# Patient Record
Sex: Male | Born: 1975 | Race: White | Hispanic: No | Marital: Married | Smoking: Former smoker
Health system: Southern US, Community
[De-identification: ages and names within clinical notes are randomized; demographics above are authoritative.]

## PROBLEM LIST (undated history)

## (undated) DIAGNOSIS — G43909 Migraine, unspecified, not intractable, without status migrainosus: Secondary | ICD-10-CM

## (undated) HISTORY — DX: Migraine, unspecified, not intractable, without status migrainosus: G43.909

---

## 2013-03-03 LAB — CBC AND DIFFERENTIAL
Neutrophils Absolute: 5
WBC: 6.9

## 2013-03-03 LAB — BASIC METABOLIC PANEL
BUN: 11 (ref 4–21)
Creatinine: 0.9 (ref 0.6–1.3)
Glucose: 94

## 2018-09-18 ENCOUNTER — Ambulatory Visit: Payer: Self-pay | Admitting: Family Medicine

## 2018-09-26 ENCOUNTER — Other Ambulatory Visit: Payer: Self-pay

## 2018-09-26 ENCOUNTER — Ambulatory Visit (INDEPENDENT_AMBULATORY_CARE_PROVIDER_SITE_OTHER): Payer: PRIVATE HEALTH INSURANCE | Admitting: Family Medicine

## 2018-09-26 ENCOUNTER — Encounter: Payer: Self-pay | Admitting: Family Medicine

## 2018-09-26 VITALS — BP 110/64 | HR 66 | Temp 98.4°F | Ht 66.0 in | Wt 157.2 lb

## 2018-09-26 DIAGNOSIS — G43809 Other migraine, not intractable, without status migrainosus: Secondary | ICD-10-CM | POA: Diagnosis not present

## 2018-09-26 DIAGNOSIS — Z0001 Encounter for general adult medical examination with abnormal findings: Secondary | ICD-10-CM | POA: Diagnosis not present

## 2018-09-26 DIAGNOSIS — Z13220 Encounter for screening for lipoid disorders: Secondary | ICD-10-CM | POA: Diagnosis not present

## 2018-09-26 DIAGNOSIS — Z6825 Body mass index (BMI) 25.0-25.9, adult: Secondary | ICD-10-CM | POA: Diagnosis not present

## 2018-09-26 DIAGNOSIS — F17200 Nicotine dependence, unspecified, uncomplicated: Secondary | ICD-10-CM

## 2018-09-26 DIAGNOSIS — R29898 Other symptoms and signs involving the musculoskeletal system: Secondary | ICD-10-CM

## 2018-09-26 DIAGNOSIS — G43909 Migraine, unspecified, not intractable, without status migrainosus: Secondary | ICD-10-CM | POA: Insufficient documentation

## 2018-09-26 DIAGNOSIS — Z87891 Personal history of nicotine dependence: Secondary | ICD-10-CM | POA: Insufficient documentation

## 2018-09-26 LAB — COMPREHENSIVE METABOLIC PANEL
ALT: 18 U/L (ref 0–53)
AST: 15 U/L (ref 0–37)
Albumin: 4.6 g/dL (ref 3.5–5.2)
Alkaline Phosphatase: 62 U/L (ref 39–117)
BUN: 16 mg/dL (ref 6–23)
CO2: 28 mEq/L (ref 19–32)
Calcium: 9.3 mg/dL (ref 8.4–10.5)
Chloride: 106 mEq/L (ref 96–112)
Creatinine, Ser: 0.91 mg/dL (ref 0.40–1.50)
GFR: 91.06 mL/min (ref >60.00)
Glucose, Bld: 86 mg/dL (ref 70–99)
Potassium: 5 mEq/L (ref 3.5–5.1)
Sodium: 141 mEq/L (ref 135–145)
Total Bilirubin: 0.7 mg/dL (ref 0.2–1.2)
Total Protein: 6.3 g/dL (ref 6.0–8.3)

## 2018-09-26 LAB — LIPID PANEL
Cholesterol: 163 mg/dL (ref 0–200)
HDL: 45.2 mg/dL (ref >39.00)
LDL Cholesterol: 108 mg/dL — ABNORMAL HIGH (ref 0–99)
NonHDL: 117.83
Total CHOL/HDL Ratio: 4
Triglycerides: 51 mg/dL (ref 0.0–149.0)
VLDL: 10.2 mg/dL (ref 0.0–40.0)

## 2018-09-26 LAB — CBC
HCT: 42.1 % (ref 39.0–52.0)
Hemoglobin: 14.2 g/dL (ref 13.0–17.0)
MCHC: 33.9 g/dL (ref 30.0–36.0)
MCV: 92.1 fl (ref 78.0–100.0)
Platelets: 260 10*3/uL (ref 150.0–400.0)
RBC: 4.57 Mil/uL (ref 4.22–5.81)
RDW: 12.9 % (ref 11.5–15.5)
WBC: 6.3 10*3/uL (ref 4.0–10.5)

## 2018-09-26 LAB — TSH: TSH: 0.83 u[IU]/mL (ref 0.35–4.50)

## 2018-09-26 MED ORDER — VARENICLINE TARTRATE 1 MG PO TABS: 1.0000 mg | ORAL_TABLET | Freq: Two times a day (BID) | ORAL | 0 refills | Status: DC

## 2018-09-26 MED ORDER — CHANTIX STARTING MONTH PAK 0.5 MG X 11 & 1 MG X 42 PO TABS: ORAL_TABLET | ORAL | 0 refills | Status: DC

## 2018-09-26 NOTE — Progress Notes (Signed)
Chief Complaint:  Dakota Sanchez is a 43 y.o. male who presents today for his annual comprehensive physical exam and to establish care.   Assessment/Plan:  Nicotine dependence with current use Patient was asked about his tobacco use today and was strongly advised to quit. Patient is currently contemplative and ready for change. We reviewed treatment options to assist him quit smoking including NRT, Chantix, and Bupropion.  We will start Chantix today.  Discussed potential effects.  Follow up at next office visit.   Total time spent counseling approximately 5 minutes.    Migraines No red flags.  Continue over-the-counter analgesics as needed.  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  May have mild meniscal tear.  Reassured patient.  Continue knee strengthening exercise.  Discussed reasons to return to care.  Body mass index is 25.38 kg/m. / Overweight BMI Metric Follow Up - 09/26/18 1044      BMI Metric Follow Up-Please document annually   BMI Metric Follow Up  Education provided        Preventative Healthcare: Check CBC, CMET, TSH, and lipid panel.   Patient Counseling(The following topics were reviewed and/or handout was given):  -Nutrition: Stressed importance of moderation in sodium/caffeine intake, saturated fat and cholesterol, caloric balance, sufficient intake of fresh fruits, vegetables, and fiber.  -Stressed the importance of regular exercise.   -Substance Abuse: Discussed cessation/primary prevention of tobacco, alcohol, or other drug use; driving or other dangerous activities under the influence; availability of treatment for abuse.   -Injury prevention: Discussed safety belts, safety helmets, smoke detector, smoking near bedding or upholstery.   -Sexuality: Discussed sexually transmitted diseases, partner selection, use of condoms, avoidance of unintended pregnancy and contraceptive alternatives.   -Dental health: Discussed importance of regular tooth  brushing, flossing, and dental visits.  -Health maintenance and immunizations reviewed. Please refer to Health maintenance section.  Return to care in 1 year for next preventative visit.     Subjective:  HPI:  He has no acute complaints today.   He has had some issues with his right knee clicking for the past several years.  No pain.  Occasionally feels tight and will click.  No swelling.  No obvious injuries.  No treatments tried.  His stable, chronic medical conditions are outlined below:   # Migraines - Takes OTC analgesics very rarely as needed  # Nicotine - Smokes about half a pack per day - Wishes to quit - Has tried cold [place] in the past which was not successful  Lifestyle Diet: Tries to eat a healthy diet. Plenty of fruits and vegetables.  Exercise: Likes to run in the morning, likes lifting weights. Likes kayaking.   Depression screen [facility] 2/9 09/26/2018  Decreased Interest 0  Down, Depressed, Hopeless 0  PHQ - 2 Score 0  Altered sleeping 1  Tired, decreased energy 0  Change in appetite 0  Feeling bad or failure about yourself  0  Trouble concentrating 0  Moving slowly or fidgety/restless 0  Suicidal thoughts 0  PHQ-9 Score 1  Difficult doing work/chores Not difficult at all    Health Maintenance Due  Topic Date Due  . HIV Screening  01/18/1991  . TETANUS/TDAP  01/18/1995     ROS: Per HPI, otherwise a complete review of systems was negative.   PMH:  The following were reviewed and entered/updated in epic: Past Medical History:  Diagnosis Date  . Migraines    Patient Active Problem List   Diagnosis Date Noted  . Nicotine  dependence with current use 09/26/2018  . Migraines    History reviewed. No pertinent surgical history.  Family History  Problem Relation Age of Onset  . Thyroid disease Sister   . Colon cancer Neg Hx   . Prostate cancer Neg Hx     Medications- reviewed and updated Current Outpatient Medications  Medication Sig Dispense  Refill  . varenicline (CHANTIX CONTINUING MONTH PAK) 1 MG tablet Take 1 tablet (1 mg total) by mouth 2 (two) times daily. 90 tablet 0  . varenicline (CHANTIX STARTING MONTH PAK) 0.5 MG X 11 & 1 MG X 42 tablet Take one 0.5 mg tablet by mouth once daily for 3 days, then increase to one 0.5 mg tablet twice daily for 4 days, then increase to one 1 mg tablet twice daily. 53 tablet 0   No current facility-administered medications for this visit.     Allergies-reviewed and updated No Known Allergies  Social History   Socioeconomic History  . Marital status: Married    Spouse name: Not on file  . Number of children: Not on file  . Years of education: Not on file  . Highest education level: Not on file  Occupational History  . Not on file  Social Needs  . Financial resource strain: Not on file  . Food insecurity    Worry: Not on file    Inability: Not on file  . Transportation needs    Medical: Not on file    Non-medical: Not on file  Tobacco Use  . Smoking status: Current Every Day Smoker  . Smokeless tobacco: Never Used  Substance and Sexual Activity  . Alcohol use: Yes  . Drug use: Never  . Sexual activity: Not on file  Lifestyle  . Physical activity    Days per week: Not on file    Minutes per session: Not on file  . Stress: Not on file  Relationships  . Social Herbalist on phone: Not on file    Gets together: Not on file    Attends religious service: Not on file    Active member of club or organization: Not on file    Attends meetings of clubs or organizations: Not on file    Relationship status: Not on file  Other Topics Concern  . Not on file  Social History Narrative  . Not on file        Objective:  Physical Exam: BP 110/64 (BP Location: Left Arm, Patient Position: Sitting, Cuff Size: Normal)   Pulse 66   Temp 98.4 F (36.9 C) (Oral)   Ht 5' 6"  (1.676 m)   Wt 157 lb 4 oz (71.3 kg)   SpO2 99%   BMI 25.38 kg/m   Body mass index is 25.38  kg/m. Wt Readings from Last 3 Encounters:  09/26/18 157 lb 4 oz (71.3 kg)   Gen: NAD, resting comfortably HEENT: TMs normal bilaterally. OP clear. No thyromegaly noted.  CV: RRR with no murmurs appreciated Pulm: NWOB, CTAB with no crackles, wheezes, or rhonchi GI: Normal bowel sounds present. Soft, Nontender, Nondistended. MSK: no edema, cyanosis, or clubbing noted. Right knee very slight crepitus with active range of motion.  Neurovascular intact distally. Skin: warm, dry Neuro: CN2-12 grossly intact. Strength 5/5 in upper and lower extremities. Reflexes symmetric and intact bilaterally.  Psych: Normal affect and thought content      M. Jerline Pain, MD 09/26/2018 10:45 AM

## 2018-09-26 NOTE — Assessment & Plan Note (Signed)
Patient was asked about his tobacco use today and was strongly advised to quit. Patient is currently contemplative and ready for change. We reviewed treatment options to assist him quit smoking including NRT, Chantix, and Bupropion.  We will start Chantix today.  Discussed potential effects.  Follow up at next office visit.   Total time spent counseling approximately 5 minutes.

## 2018-09-26 NOTE — Assessment & Plan Note (Signed)
No red flags.  Continue over-the-counter analgesics as needed.

## 2018-09-26 NOTE — Patient Instructions (Signed)
It was very nice to see you today!  Please start the Chantix.  Let me know if you have any side effects or if you need any further assistance with smoking cessation.  Your knee clicking is probably from arthritis and possibly a meniscal tear.  Please continue living a active lifestyle as this is the best thing you can do for this.  Let me know if symptoms worsen.  Come back to see me in 1 year for your next physical, or sooner if needed.  Take care, Dr Jerline Pain  Please try these tips to maintain a healthy lifestyle:   Eat at least 3 REAL meals and 1-2 snacks per day.  Aim for no more than 5 hours between eating.  If you eat breakfast, please do so within one hour of getting up.    Obtain twice as many fruits/vegetables as protein or carbohydrate foods for both lunch and dinner. (Half of each meal should be fruits/vegetables, one quarter protein, and one quarter starchy carbs)   Cut down on sweet beverages. This includes juice, soda, and sweet tea.    Exercise at least 150 minutes every week.    Preventive Care 37-66 Years Old, Male Preventive care refers to lifestyle choices and visits with your health care provider that can promote health and wellness. This includes:  A yearly physical exam. This is also called an annual well check.  Regular dental and eye exams.  Immunizations.  Screening for certain conditions.  Healthy lifestyle choices, such as eating a healthy diet, getting regular exercise, not using drugs or products that contain nicotine and tobacco, and limiting alcohol use. What can I expect for my preventive care visit? Physical exam Your health care provider will check:  Height and weight. These may be used to calculate body mass index (BMI), which is a measurement that tells if you are at a healthy weight.  Heart rate and blood pressure.  Your skin for abnormal spots. Counseling Your health care provider may ask you questions about:  Alcohol, tobacco, and  drug use.  Emotional well-being.  Home and relationship well-being.  Sexual activity.  Eating habits.  Work and work Statistician. What immunizations do I need?  Influenza (flu) vaccine  This is recommended every year. Tetanus, diphtheria, and pertussis (Tdap) vaccine  You may need a Td booster every 10 years. Varicella (chickenpox) vaccine  You may need this vaccine if you have not already been vaccinated. Zoster (shingles) vaccine  You may need this after age 40. Measles, mumps, and rubella (MMR) vaccine  You may need at least one dose of MMR if you were born in 1957 or later. You may also need a second dose. Pneumococcal conjugate (PCV13) vaccine  You may need this if you have certain conditions and were not previously vaccinated. Pneumococcal polysaccharide (PPSV23) vaccine  You may need one or two doses if you smoke cigarettes or if you have certain conditions. Meningococcal conjugate (MenACWY) vaccine  You may need this if you have certain conditions. Hepatitis A vaccine  You may need this if you have certain conditions or if you travel or work in places where you may be exposed to hepatitis A. Hepatitis B vaccine  You may need this if you have certain conditions or if you travel or work in places where you may be exposed to hepatitis B. Haemophilus influenzae type b (Hib) vaccine  You may need this if you have certain risk factors. Human papillomavirus (HPV) vaccine  If recommended by your  health care provider, you may need three doses over 6 months. You may receive vaccines as individual doses or as more than one vaccine together in one shot (combination vaccines). Talk with your health care provider about the risks and benefits of combination vaccines. What tests do I need? Blood tests  Lipid and cholesterol levels. These may be checked every 5 years, or more frequently if you are over 66 years old.  Hepatitis C test.  Hepatitis B test. Screening   Lung cancer screening. You may have this screening every year starting at age 46 if you have a 30-pack-year history of smoking and currently smoke or have quit within the past 15 years.  Prostate cancer screening. Recommendations will vary depending on your family history and other risks.  Colorectal cancer screening. All adults should have this screening starting at age 60 and continuing until age 61. Your health care provider may recommend screening at age 70 if you are at increased risk. You will have tests every 1-10 years, depending on your results and the type of screening test.  Diabetes screening. This is done by checking your blood sugar (glucose) after you have not eaten for a while (fasting). You may have this done every 1-3 years.  Sexually transmitted disease (STD) testing. Follow these instructions at home: Eating and drinking  Eat a diet that includes fresh fruits and vegetables, whole grains, lean protein, and low-fat dairy products.  Take vitamin and mineral supplements as recommended by your health care provider.  Do not drink alcohol if your health care provider tells you not to drink.  If you drink alcohol: ? Limit how much you have to 0-2 drinks a day. ? Be aware of how much alcohol is in your drink. In the [place] one drink equals one 12 oz bottle of beer (355 mL), one 5 oz glass of wine (148 mL), or one 1 oz glass of hard liquor (44 mL). Lifestyle  Take daily care of your teeth and gums.  Stay active. Exercise for at least 30 minutes on 5 or more days each week.  Do not use any products that contain nicotine or tobacco, such as cigarettes, e-cigarettes, and chewing tobacco. If you need help quitting, ask your health care provider.  If you are sexually active, practice safe sex. Use a condom or other form of protection to prevent STIs (sexually transmitted infections).  Talk with your health care provider about taking a low-dose aspirin every day starting at age  67. What's next?  Go to your health care provider once a year for a well check visit.  Ask your health care provider how often you should have your eyes and teeth checked.  Stay up to date on all vaccines. This information is not intended to replace advice given to you by your health care provider. Make sure you discuss any questions you have with your health care provider. Document Released: 03/25/2015 Document Revised: 02/20/2018 Document Reviewed: 02/20/2018 Elsevier Patient Education  2020 [employer].

## 2018-09-29 ENCOUNTER — Encounter: Payer: Self-pay | Admitting: Family Medicine

## 2018-09-29 DIAGNOSIS — E785 Hyperlipidemia, unspecified: Secondary | ICD-10-CM | POA: Insufficient documentation

## 2018-09-29 NOTE — Progress Notes (Signed)
Please inform patient of the following:  His "bad" cholesterol was a bit high but everything else looks great. No need to start medications. He should keep up the good work and we can recheck in a year or so.

## 2018-10-08 ENCOUNTER — Encounter: Payer: Self-pay | Admitting: Family Medicine

## 2018-10-21 ENCOUNTER — Ambulatory Visit: Payer: Self-pay | Admitting: Family Medicine

## 2018-12-12 ENCOUNTER — Encounter: Payer: Self-pay | Admitting: Family Medicine

## 2018-12-12 ENCOUNTER — Other Ambulatory Visit: Payer: Self-pay

## 2018-12-12 ENCOUNTER — Ambulatory Visit (INDEPENDENT_AMBULATORY_CARE_PROVIDER_SITE_OTHER): Payer: PRIVATE HEALTH INSURANCE | Admitting: Family Medicine

## 2018-12-12 VITALS — BP 110/72 | HR 71 | Temp 98.1°F | Ht 66.0 in | Wt 160.0 lb

## 2018-12-12 DIAGNOSIS — M5412 Radiculopathy, cervical region: Secondary | ICD-10-CM

## 2018-12-12 DIAGNOSIS — F17200 Nicotine dependence, unspecified, uncomplicated: Secondary | ICD-10-CM | POA: Diagnosis not present

## 2018-12-12 MED ORDER — DICLOFENAC SODIUM 75 MG PO TBEC: 75.0000 mg | DELAYED_RELEASE_TABLET | Freq: Two times a day (BID) | ORAL | 0 refills | Status: DC

## 2018-12-12 MED ORDER — METHYLPREDNISOLONE ACETATE 80 MG/ML IJ SUSP
80.0000 mg | Freq: Once | INTRAMUSCULAR | Status: AC
  Administered 2018-12-12: 80 mg via INTRAMUSCULAR

## 2018-12-12 NOTE — Progress Notes (Signed)
   Chief Complaint:  Dakota Sanchez is a 43 y.o. male who presents for same day appointment with a chief complaint of shoulder pain.   Assessment/Plan:  Shoulder pain/cervical radiculopathy Positive Spurling on exam.  Will give 80 mg IM Depo-Medrol today.  Will start diclofenac 75 mg twice daily for the next 1 to 2 weeks.  Discussed exercises and handout was given.  Discussed reasons to return to care.  Follow-up as needed.  Nicotine dependence with current use Patient down to about a third of a pack per day. Congratulated patient on success thus far and encouraged continued cessation. He will continue chantix.      Subjective:  HPI:  Shoulder Pain Started about 3 weeks ago.  Located in left shoulder. Radiating into back of left arm. Tried heating pads and ice packs with modest improvement. Got a massage which did not help. Pain is dull in nature but sometimes feels "hot."  Occasionally has some pins-and-needles in his hands.  No weakness.  Worse with certain motions. No other obvious alleviating or aggravating factors.   Smoker Patient is down to about a pack per day.   ROS: Per HPI  PMH: He reports that he has been smoking. He has never used smokeless tobacco. He reports current alcohol use. He reports that he does not use drugs.      Objective:  Physical Exam: BP 110/72   Pulse 71   Temp 98.1 F (36.7 C)   Ht 5' 6"  (1.676 m)   Wt 160 lb (72.6 kg)   SpO2 98%   BMI 25.82 kg/m   Gen: NAD, resting comfortably MSK: -Neck: No deformities.  Tender to palpation along the left paraspinal muscles.  Full range of motion throughout.  Spurling positive on the left -Left upper extremity: No deformities.  Tender to palpation along posterior shoulder.  Strength 5 out of 5 throughout.  Neer and Hawkins test negative.  Supraspinatus testing normal.  Normal strength with internal and external rotation.  Neurovascular intact distally.     Algis Greenhouse. Jerline Pain, MD 12/12/2018 8:30 AM

## 2018-12-12 NOTE — Patient Instructions (Addendum)
It was very nice to see you today!  You have a strain in your trapezius.  This is causing a pinched nerve in your neck.  Please work on exercises.  Please use the diclofenac twice daily for the next week or so, then as needed.  We will give you an injection of cortisone today.  Let me if your symptoms did not improve the next few weeks.  Take care, Dr Jerline Pain  Please try these tips to maintain a healthy lifestyle:   Eat at least 3 REAL meals and 1-2 snacks per day.  Aim for no more than 5 hours between eating.  If you eat breakfast, please do so within one hour of getting up.    Obtain twice as many fruits/vegetables as protein or carbohydrate foods for both lunch and dinner. (Half of each meal should be fruits/vegetables, one quarter protein, and one quarter starchy carbs)   Cut down on sweet beverages. This includes juice, soda, and sweet tea.    Exercise at least 150 minutes every week.

## 2018-12-12 NOTE — Assessment & Plan Note (Signed)
Patient down to about a third of a pack per day. Congratulated patient on success thus far and encouraged continued cessation. He will continue chantix.

## 2019-01-07 ENCOUNTER — Encounter: Payer: Self-pay | Admitting: Family Medicine

## 2019-01-07 ENCOUNTER — Other Ambulatory Visit: Payer: Self-pay

## 2019-01-07 ENCOUNTER — Ambulatory Visit (INDEPENDENT_AMBULATORY_CARE_PROVIDER_SITE_OTHER): Payer: PRIVATE HEALTH INSURANCE | Admitting: Family Medicine

## 2019-01-07 VITALS — BP 124/76 | HR 69 | Temp 98.6°F | Ht 66.0 in | Wt 158.0 lb

## 2019-01-07 DIAGNOSIS — M5412 Radiculopathy, cervical region: Secondary | ICD-10-CM | POA: Diagnosis not present

## 2019-01-07 MED ORDER — GABAPENTIN 300 MG PO CAPS: 300.0000 mg | ORAL_CAPSULE | Freq: Three times a day (TID) | ORAL | 3 refills | Status: DC

## 2019-01-07 MED ORDER — PREDNISONE 50 MG PO TABS: ORAL_TABLET | ORAL | 0 refills | Status: DC

## 2019-01-07 NOTE — Patient Instructions (Signed)
It was very nice to see you today!  Please start the prednisone.  Please take the gabapentin at night if needed.  I will place referral for you to see orthopedic surgeon.  Take care, Dr Jerline Pain  Please try these tips to maintain a healthy lifestyle:   Eat at least 3 REAL meals and 1-2 snacks per day.  Aim for no more than 5 hours between eating.  If you eat breakfast, please do so within one hour of getting up.    Obtain twice as many fruits/vegetables as protein or carbohydrate foods for both lunch and dinner. (Half of each meal should be fruits/vegetables, one quarter protein, and one quarter starchy carbs)   Cut down on sweet beverages. This includes juice, soda, and sweet tea.    Exercise at least 150 minutes every week.

## 2019-01-07 NOTE — Progress Notes (Signed)
   Chief Complaint:  Dakota Sanchez is a 43 y.o. male who presents today with a chief complaint of cervical radiculopathy.   Assessment/Plan:  Cervical radiculopathy Did not respond well to diclofenac.  Will start prednisone today.  We will also start gabapentin.  Will place referral to orthopedics for further management/evaluation.    Subjective:  HPI:  Cervical radiculopathy Patient seen 4 weeks ago for shoulder pain.  He was noted to have positive Spurling maneuver on exam.  He was given 80 mg of IM Depo-Medrol and started on diclofenac 75 mg twice daily.  Unfortunately symptoms have not improved and may have worsened recently.  Diclofenac seem to help but only modestly.  Still has quite a bit of pins-and-needles sensation throughout his entire left arm and into all of the fingers in his hand.  No weakness.  Symptoms seem to be worse at night.  ROS: Per HPI  PMH: He reports that he has been smoking. He has never used smokeless tobacco. He reports current alcohol use. He reports that he does not use drugs.      Objective:  Physical Exam: BP 124/76   Pulse 69   Temp 98.6 F (37 C)   Ht 5' 6"  (1.676 m)   Wt 158 lb (71.7 kg)   SpO2 97%   BMI 25.50 kg/m   Gen: NAD, resting comfortably MSK: -Neck: No deformities.  Spurling positive on left.  Range of motion throughout -Left upper extremity: No deformities.  Strength 5/5 in all directions.  Neurovascular intact distally.     Algis Greenhouse. Jerline Pain, MD 01/07/2019 10:59 AM

## 2019-01-14 ENCOUNTER — Encounter: Payer: Self-pay | Admitting: Family Medicine

## 2019-01-14 ENCOUNTER — Ambulatory Visit: Payer: Self-pay

## 2019-01-14 ENCOUNTER — Other Ambulatory Visit: Payer: Self-pay

## 2019-01-14 ENCOUNTER — Ambulatory Visit (INDEPENDENT_AMBULATORY_CARE_PROVIDER_SITE_OTHER): Payer: PRIVATE HEALTH INSURANCE | Admitting: Family Medicine

## 2019-01-14 DIAGNOSIS — M542 Cervicalgia: Secondary | ICD-10-CM | POA: Diagnosis not present

## 2019-01-14 MED ORDER — TIZANIDINE HCL 2 MG PO TABS: 2.0000 mg | ORAL_TABLET | Freq: Four times a day (QID) | ORAL | 1 refills | Status: DC | PRN

## 2019-01-14 MED ORDER — NABUMETONE 750 MG PO TABS: 750.0000 mg | ORAL_TABLET | Freq: Two times a day (BID) | ORAL | 6 refills | Status: DC | PRN
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
## 2019-05-21 ENCOUNTER — Ambulatory Visit: Payer: PRIVATE HEALTH INSURANCE | Attending: Internal Medicine

## 2019-05-21 DIAGNOSIS — Z23 Encounter for immunization: Secondary | ICD-10-CM

## 2019-05-21 NOTE — Progress Notes (Signed)
   Covid-19 Vaccination Clinic  Name:  Dakota Sanchez    MRN: [id number] DOB: Jul 14, 1975  05/21/2019  Dakota Sanchez was observed post Covid-19 immunization for 15 minutes without incident. He was provided with Vaccine Information Sheet and instruction to access the V-Safe system.   Dakota Sanchez was instructed to call 911 with any severe reactions post vaccine: Marland Kitchen Difficulty breathing  . Swelling of face and throat  . A fast heartbeat  . A bad rash all over body  . Dizziness and weakness   Immunizations Administered    Name Date Dose VIS Date Route   Moderna COVID-19 Vaccine 05/21/2019 10:22 AM 0.5 mL 02/10/2019 Intramuscular   Manufacturer: Moderna   Lot: [id number]   Josephine: [id number]

## 2019-06-23 ENCOUNTER — Ambulatory Visit: Payer: PRIVATE HEALTH INSURANCE | Attending: Internal Medicine

## 2019-06-23 DIAGNOSIS — Z23 Encounter for immunization: Secondary | ICD-10-CM

## 2019-06-23 NOTE — Progress Notes (Signed)
   Covid-19 Vaccination Clinic  Name:  Dakota Sanchez    MRN: [id number] DOB: 1975/11/27  06/23/2019  Mr. [name] was observed post Covid-19 immunization for 15 minutes without incident. He was provided with Vaccine Information Sheet and instruction to access the V-Safe system.   Mr. [name] was instructed to call 911 with any severe reactions post vaccine: Marland Kitchen Difficulty breathing  . Swelling of face and throat  . A fast heartbeat  . A bad rash all over body  . Dizziness and weakness   Immunizations Administered    Name Date Dose VIS Date Route   Moderna COVID-19 Vaccine 06/23/2019  9:40 AM 0.5 mL 02/10/2019 Intramuscular   Manufacturer: Levan Hurst   Lot: [id number]   [place]: [id number]      Covid-19 Vaccination Clinic  Name:  Dakota Sanchez    MRN: [id number] DOB: 1975/11/18  06/23/2019  Mr. [name] was observed post Covid-19 immunization for 15 minutes without incident. He was provided with Vaccine Information Sheet and instruction to access the V-Safe system.   Mr. [name] was instructed to call 911 with any severe reactions post vaccine: Marland Kitchen Difficulty breathing  . Swelling of face and throat  . A fast heartbeat  . A bad rash all over body  . Dizziness and weakness   Immunizations Administered    Name Date Dose VIS Date Route   Moderna COVID-19 Vaccine 06/23/2019  9:40 AM 0.5 mL 02/10/2019 Intramuscular   Manufacturer: Moderna   Lot: [id number]   [place]: [id number]

## 2019-07-30 ENCOUNTER — Ambulatory Visit (INDEPENDENT_AMBULATORY_CARE_PROVIDER_SITE_OTHER): Payer: PRIVATE HEALTH INSURANCE | Admitting: Family Medicine

## 2019-07-30 ENCOUNTER — Encounter: Payer: Self-pay | Admitting: Family Medicine

## 2019-07-30 ENCOUNTER — Other Ambulatory Visit: Payer: Self-pay

## 2019-07-30 VITALS — BP 110/70 | HR 64 | Temp 98.3°F | Ht 66.0 in | Wt 155.8 lb

## 2019-07-30 DIAGNOSIS — L723 Sebaceous cyst: Secondary | ICD-10-CM

## 2019-07-30 NOTE — Progress Notes (Signed)
   Dakota Sanchez is a 44 y.o. male who presents today for an office visit.  Assessment/Plan:  New/Acute Problems: Inflamed Sebaceous Cyst I&D Performed today - see below procedure note. Tolerated well. Discussed care after and reasons to return to care. Can use OTC meds if needed.     Subjective:  HPI:  Patient with several month history of bump behind his left ear.  Over last week is becoming more painful, red, and enlarged.  Tried using warm compresses and tea bags with no improvement.  His wife tried poking it with a needle with no improvement.  Symptoms have progressed.  No reported fevers or chills.       Objective:  Physical Exam: BP 110/70 (BP Location: Left Arm, Patient Position: Sitting, Cuff Size: Normal)   Pulse 64   Temp 98.3 F (36.8 C) (Temporal)   Ht 5' 6"  (1.676 m)   Wt 155 lb 12.8 oz (70.7 kg)   SpO2 100%   BMI 25.15 kg/m   Gen: No acute distress, resting comfortably Skin: 2 cm fluctuant mass on skin overlying left mastoid process.  Mildly tender to palpation. Psych: Normal affect and thought content  Incision and Drainage Procedure Note  Pre-operative Diagnosis: Inflamed sebaceous cyst  Post-operative Diagnosis: same  Indications: Therapeutic  Anesthesia: 1% plain lidocaine  Procedure Details  The procedure, risks and complications have been discussed in detail (including, but not limited to airway compromise, infection, bleeding) with the patient, and the patient has signed consent to the procedure.  The skin was sterilely prepped and draped over the affected area in the usual fashion. After adequate local anesthesia, I&D with a 3 mm punch was performed on the area described above.  Purulent material was expressed.  Area was bandaged with sterile dressing and topical antibiotic ointment.  The patient was observed until stable.  Findings: Abscess  EBL: 3 cc's  Condition: Tolerated procedure well   Complications: none.        Dakota Sanchez.  Jerline Pain, MD 07/30/2019 10:52 AM

## 2019-07-30 NOTE — Patient Instructions (Signed)
Incision and Drainage, Care After This sheet gives you information about how to care for yourself after your procedure. Your health care provider may also give you more specific instructions. If you have problems or questions, contact your health care provider. What can I expect after the procedure? After the procedure, it is common to have:  Pain or discomfort around the incision site.  Blood, fluid, or pus (drainage) from the incision.  Redness and firm skin around the incision site. Follow these instructions at home: Medicines  Take over-the-counter and prescription medicines only as told by your health care provider.  If you were prescribed an antibiotic medicine, use or take it as told by your health care provider. Do not stop using the antibiotic even if you start to feel better. Wound care Follow instructions from your health care provider about how to take care of your wound. Make sure you:  Wash your hands with soap and water before and after you change your bandage (dressing). If soap and water are not available, use hand sanitizer.  Change your dressing and packing as told by your health care provider. ? If your dressing is dry or stuck when you try to remove it, moisten or wet the dressing with saline or water so that it can be removed without harming your skin or tissues. ? If your wound is packed, leave it in place until your health care provider tells you to remove it. To remove the packing, moisten or wet the packing with saline or water so that it can be removed without harming your skin or tissues.  Leave stitches (sutures), skin glue, or adhesive strips in place. These skin closures may need to stay in place for 2 weeks or longer. If adhesive strip edges start to loosen and curl up, you may trim the loose edges. Do not remove adhesive strips completely unless your health care provider tells you to do that. Check your wound every day for signs of infection. Check  for:  More redness, swelling, or pain.  More fluid or blood.  Warmth.  Pus or a bad smell. If you were sent home with a drain tube in place, follow instructions from your health care provider about:  How to empty it.  How to care for it at home.  General instructions  Rest the affected area.  Do not take baths, swim, or use a hot tub until your health care provider approves. Ask your health care provider if you may take showers. You may only be allowed to take sponge baths.  Return to your normal activities as told by your health care provider. Ask your health care provider what activities are safe for you. Your health care provider may put you on activity or lifting restrictions.  The incision will continue to drain. It is normal to have some clear or slightly bloody drainage. The amount of drainage should lessen each day.  Do not apply any creams, ointments, or liquids unless you have been told to by your health care provider.  Keep all follow-up visits as told by your health care provider. This is important. Contact a health care provider if:  Your cyst or abscess returns.  You have a fever or chills.  You have more redness, swelling, or pain around your incision.  You have more fluid or blood coming from your incision.  Your incision feels warm to the touch.  You have pus or a bad smell coming from your incision.  You have red streaks  above or below the incision site. Get help right away if:  You have severe pain or bleeding.  You cannot eat or drink without vomiting.  You have decreased urine output.  You become short of breath.  You have chest pain.  You cough up blood.  The affected area becomes numb or starts to tingle. These symptoms may represent a serious problem that is an emergency. Do not wait to see if the symptoms will go away. Get medical help right away. Call your local emergency services (911 in the [place] Do not drive yourself to the  hospital. Summary  After this procedure, it is common to have fluid, blood, or pus coming from the surgery site.  Follow all home care instructions. You will be told how to take care of your incision, how to check for infection, and how to take medicines.  If you were prescribed an antibiotic medicine, take it as told by your health care provider. Do not stop taking the antibiotic even if you start to feel better.  Contact a health care provider if you have increased redness, swelling, or pain around your incision. Get help right away if you have chest pain, you vomit, you cough up blood, or you have shortness of breath.  Keep all follow-up visits as told by your health care provider. This is important. This information is not intended to replace advice given to you by your health care provider. Make sure you discuss any questions you have with your health care provider. Document Revised: 01/27/2018 Document Reviewed: 01/27/2018 Elsevier Patient Education  2020 [employer].

## 2020-01-25 ENCOUNTER — Ambulatory Visit (INDEPENDENT_AMBULATORY_CARE_PROVIDER_SITE_OTHER): Payer: PRIVATE HEALTH INSURANCE | Admitting: Family Medicine

## 2020-01-25 ENCOUNTER — Encounter: Payer: Self-pay | Admitting: Family Medicine

## 2020-01-25 ENCOUNTER — Other Ambulatory Visit: Payer: Self-pay

## 2020-01-25 DIAGNOSIS — M542 Cervicalgia: Secondary | ICD-10-CM

## 2020-01-25 MED ORDER — MELOXICAM 15 MG PO TABS: 15.0000 mg | ORAL_TABLET | Freq: Every day | ORAL | 0 refills | Status: DC

## 2020-01-25 MED ORDER — TIZANIDINE HCL 4 MG PO TABS: 4.0000 mg | ORAL_TABLET | Freq: Four times a day (QID) | ORAL | 0 refills | Status: DC | PRN

## 2020-01-25 MED ORDER — KETOROLAC TROMETHAMINE 60 MG/2ML IM SOLN
60.0000 mg | Freq: Once | INTRAMUSCULAR | Status: AC
  Administered 2020-01-25: 60 mg via INTRAMUSCULAR

## 2020-01-25 NOTE — Assessment & Plan Note (Signed)
Acute flare.  Do red flags. Will give 60 mg of Toradol today.  Will start Zanaflex 4 mg 4 times daily as needed and meloxicam 15 mg daily.  Will place referral to orthopedics for further management/evaluation.  Discussed reasons to return to care.

## 2020-01-25 NOTE — Patient Instructions (Signed)
It was very nice to see you today!  We will give you an injection of medication called Toradol today.  Please start the Mobic and Zanaflex tomorrow.  I will send in a referral for you to see the orthopedic spine specialist.  Let us know if your pain is not improving.  Take care, Dr Jerline Pain  Please try these tips to maintain a healthy lifestyle:   Eat at least 3 REAL meals and 1-2 snacks per day.  Aim for no more than 5 hours between eating.  If you eat breakfast, please do so within one hour of getting up.    Each meal should contain half fruits/vegetables, one quarter protein, and one quarter carbs (no bigger than a computer mouse)   Cut down on sweet beverages. This includes juice, soda, and sweet tea.     Drink at least 1 glass of water with each meal and aim for at least 8 glasses per day   Exercise at least 150 minutes every week.

## 2020-01-25 NOTE — Progress Notes (Signed)
   Dakota Sanchez is a 44 y.o. male who presents today for an office visit.  Assessment/Plan:  Chronic Problems Addressed Today: Cervicalgia Acute flare.  Do red flags. Will give 60 mg of Toradol today.  Will start Zanaflex 4 mg 4 times daily as needed and meloxicam 15 mg daily.  Will place referral to orthopedics for further management/evaluation.  Discussed reasons to return to care.     Subjective:  HPI:  Neck and shoulder pain.  Had something similar a year ago.  Is up seeing sports medicine and physical therapy.  Symptoms gradually resolved with physical therapy.  He went to the chiropractor about a week ago.  Symptoms have been worse since then.  Has been taking Tylenol with no significant improvement.  Some occasional numbness into left arm.  No reported weakness.       Objective:  Physical Exam: BP 117/74   Pulse 84   Temp 98.8 F (37.1 C) (Temporal)   Ht 5' 6"  (1.676 m)   Wt 150 lb 12.8 oz (68.4 kg)   SpO2 (!) 82%   BMI 24.34 kg/m   Gen: No acute distress, resting comfortably CV: Regular rate and rhythm with no murmurs appreciated Pulm: Normal work of breathing, clear to auscultation bilaterally with no crackles, wheezes, or rhonchi MSK: Left arm without deformities.  Neurovascular intact distally.  Tenderness to palpation at left cervical paraspinal muscles. Neuro: Grossly normal, moves all extremities Psych: Normal affect and thought content      Bryahna Lesko M. Jerline Pain, MD 01/25/2020 3:06 PM

## 2020-01-29 ENCOUNTER — Other Ambulatory Visit: Payer: Self-pay

## 2020-01-29 ENCOUNTER — Ambulatory Visit (INDEPENDENT_AMBULATORY_CARE_PROVIDER_SITE_OTHER): Payer: PRIVATE HEALTH INSURANCE | Admitting: Family Medicine

## 2020-01-29 ENCOUNTER — Other Ambulatory Visit: Payer: Self-pay | Admitting: Family Medicine

## 2020-01-29 DIAGNOSIS — M542 Cervicalgia: Secondary | ICD-10-CM | POA: Diagnosis not present

## 2020-01-29 MED ORDER — GABAPENTIN 100 MG PO CAPS: ORAL_CAPSULE | ORAL | 3 refills | Status: DC

## 2020-01-29 NOTE — Progress Notes (Signed)
   Office Visit Note   Patient: Dakota Sanchez           Date of Birth: 10/16/1975           MRN: [id number] Visit Date: 01/29/2020 Requested by: Vivi Barrack, MD [id number] [place],  [place] [address] PCP: Vivi Barrack, MD  Subjective: Chief Complaint  Patient presents with  . Neck - Pain    Stabbing pains in the posterior neck, with pain radiating down the left arm. Has not slept well for the past 2 weeks. The pain started about 2 & 1/2 weeks ago. Has been going to chiropractor - pain is worsening.    HPI: He is here with left-sided neck pain.  I saw him a year ago for right-sided pain and it eventually resolved with physical therapy and chiropractic.  He was completely pain-free until about 2 weeks ago.  He has been very active this past year, he quit smoking, he has began exercising, and lost 20 pounds.  He was feeling very well.  He was doing some lifting and started noticing some pain in his neck at the base.  Now he has pain radiating down the left arm and for the past 2 weeks he has been unable to sleep at night.  He went to his PCP and was given a muscle relaxant and anti-inflammatory which have helped, but is not making the pain tolerable.  He has also been working again with Dr. Owens Shark for chiropractic treatments but this time that is not helping either.                ROS:   All other systems were reviewed and are negative.  Objective: Vital Signs: There were no vitals taken for this visit.  Physical Exam:  General:  Alert and oriented, in no acute distress. Pulm:  Breathing unlabored. Psy:  Normal mood, congruent affect. Skin: No rash Neck: He has negative Spurling's test, good range of motion.  He is very tender near the C7 process to the left of midline.  Tender in the trapezius muscle as well.  Upper extremity strength and reflexes remain normal.  Imaging: No results found.  Assessment & Plan: 1.  Neck pain with left arm radiculopathy concerning for disc  protrusion -We will try gabapentin.  MRI ordered.  I will discuss results when available.     Procedures: No procedures performed  No notes on file     PMFS History: Patient Active Problem List   Diagnosis Date Noted  . Cervicalgia 01/25/2020  . Dyslipidemia 09/29/2018  . Former smoker 09/26/2018  . Migraines    Past Medical History:  Diagnosis Date  . Migraines     Family History  Problem Relation Age of Onset  . Thyroid disease Sister   . Colon cancer Neg Hx   . Prostate cancer Neg Hx     No past surgical history on file. Social History   Occupational History  . Not on file  Tobacco Use  . Smoking status: Former Smoker    Quit date: 11/11/2019    Years since quitting: 0.2  . Smokeless tobacco: Never Used  Vaping Use  . Vaping Use: Never used  Substance and Sexual Activity  . Alcohol use: Yes  . Drug use: Never  . Sexual activity: Not on file

## 2020-02-09 ENCOUNTER — Ambulatory Visit
Admission: RE | Admit: 2020-02-09 | Discharge: 2020-02-09 | Disposition: A | Discharge status: Home / Self Care | Payer: PRIVATE HEALTH INSURANCE | Source: Ambulatory Visit | Attending: Family Medicine | Admitting: Family Medicine

## 2020-02-09 ENCOUNTER — Other Ambulatory Visit: Payer: PRIVATE HEALTH INSURANCE

## 2020-02-09 DIAGNOSIS — M542 Cervicalgia: Secondary | ICD-10-CM

## 2020-02-10 ENCOUNTER — Telehealth: Payer: Self-pay | Admitting: Family Medicine

## 2020-02-10 DIAGNOSIS — M542 Cervicalgia: Secondary | ICD-10-CM

## 2020-02-10 NOTE — Telephone Encounter (Signed)
MRI shows narrowing of the left-sided nerve opening at C5-6 due to disc bulge and bone spurring.  There's a right-sided protrusion at C6-7, but the C5-6 level is the most likely source of the current pain.  No clear-cut indication for surgery at this point.    If pain is still severe, could either try physical therapy, or make referral to Dr. Ernestina Patches in our office for an epidural steroid injection.

## 2020-02-10 NOTE — Addendum Note (Signed)
Addended by: Hortencia Pilar on: 02/10/2020 10:45 AM   Modules accepted: Orders

## 2020-02-11 ENCOUNTER — Other Ambulatory Visit: Payer: Self-pay | Admitting: Family Medicine

## 2020-02-11 MED ORDER — MELOXICAM 15 MG PO TABS
15.0000 mg | ORAL_TABLET | Freq: Every day | ORAL | 0 refills | Status: DC
Start: 2020-02-11 — End: 2020-03-10

## 2020-02-11 MED ORDER — TIZANIDINE HCL 4 MG PO TABS: 4.0000 mg | ORAL_TABLET | Freq: Four times a day (QID) | ORAL | 0 refills | Status: DC | PRN

## 2020-02-15 NOTE — Addendum Note (Signed)
Addended by: Hortencia Pilar on: 02/15/2020 04:26 PM   Modules accepted: Orders

## 2020-02-15 NOTE — Telephone Encounter (Signed)
Sent patient a message advising that this is pending insurance approval and we will call to schedule as soon as it is approved.

## 2020-02-16 ENCOUNTER — Other Ambulatory Visit: Payer: Self-pay | Admitting: Family Medicine

## 2020-02-18 ENCOUNTER — Other Ambulatory Visit: Payer: Self-pay

## 2020-02-18 ENCOUNTER — Ambulatory Visit
Admission: RE | Admit: 2020-02-18 | Discharge: 2020-02-18 | Disposition: A | Discharge status: Home / Self Care | Payer: PRIVATE HEALTH INSURANCE | Source: Ambulatory Visit | Attending: Family Medicine | Admitting: Family Medicine

## 2020-02-18 DIAGNOSIS — M542 Cervicalgia: Secondary | ICD-10-CM

## 2020-02-18 MED ORDER — IOPAMIDOL (ISOVUE-M 300) INJECTION 61%
1.0000 mL | Freq: Once | INTRAMUSCULAR | Status: AC | PRN
  Administered 2020-02-18: 1 mL via EPIDURAL

## 2020-02-18 MED ORDER — TRIAMCINOLONE ACETONIDE 40 MG/ML IJ SUSP (RADIOLOGY)
60.0000 mg | Freq: Once | INTRAMUSCULAR | Status: AC
  Administered 2020-02-18: 60 mg via EPIDURAL

## 2020-02-18 NOTE — Discharge Instructions (Signed)

## 2020-03-10 ENCOUNTER — Other Ambulatory Visit: Payer: Self-pay | Admitting: Family Medicine

## 2020-03-17 ENCOUNTER — Other Ambulatory Visit: Payer: Self-pay

## 2020-03-17 ENCOUNTER — Ambulatory Visit: Payer: Self-pay

## 2020-03-17 ENCOUNTER — Ambulatory Visit (INDEPENDENT_AMBULATORY_CARE_PROVIDER_SITE_OTHER): Payer: PRIVATE HEALTH INSURANCE | Admitting: Physical Medicine and Rehabilitation

## 2020-03-17 ENCOUNTER — Encounter: Payer: Self-pay | Admitting: Physical Medicine and Rehabilitation

## 2020-03-17 VITALS — BP 102/66 | HR 61

## 2020-03-17 DIAGNOSIS — M4802 Spinal stenosis, cervical region: Secondary | ICD-10-CM

## 2020-03-17 DIAGNOSIS — M5412 Radiculopathy, cervical region: Secondary | ICD-10-CM

## 2020-03-17 MED ORDER — BETAMETHASONE SOD PHOS & ACET 6 (3-3) MG/ML IJ SUSP
12.0000 mg | Freq: Once | INTRAMUSCULAR | Status: AC
  Administered 2020-03-17: 12 mg

## 2020-03-17 NOTE — Progress Notes (Signed)
Pt state neck pain that travels down his left arm. Pt state sitting, lifting and moving makes the pain worse. Pt state ice pack, pain meds and excise help ease the pain.  Numeric Pain Rating Scale and Functional Assessment Average Pain 7   In the last MONTH (on 0-10 scale) has pain interfered with the following?  1. General activity like being  able to carry out your everyday physical activities such as walking, climbing stairs, carrying groceries, or moving a chair?  Rating(10)   +Driver, -BT, -Dye Allergies.

## 2020-03-17 NOTE — Procedures (Signed)
Cervical Epidural Steroid Injection - Interlaminar Approach with Fluoroscopic Guidance  Patient: Dakota Sanchez      Date of Birth: [birth year] MRN: [id number] PCP: Vivi Barrack, MD      Visit Date: 03/17/2020   Universal Protocol:    Date/Time: 01/06/229:23 AM  Consent Given By: the patient  Position: PRONE  Additional Comments: Vital signs were monitored before and after the procedure. Patient was prepped and draped in the usual sterile fashion. The correct patient, procedure, and site was verified.   Injection Procedure Details:   Procedure diagnoses: Cervical radiculopathy [M54.12]    Meds Administered:  Meds ordered this encounter  Medications  . betamethasone acetate-betamethasone sodium phosphate (CELESTONE) injection 12 mg     Laterality: Left  Location/Site: C7-T1  Needle: 3.5 in., 20 ga. Tuohy  Needle Placement: Paramedian epidural space  Findings:  -Comments: Excellent flow of contrast into the epidural space.  Procedure Details: Using a paramedian approach from the side mentioned above, the region overlying the inferior lamina was localized under fluoroscopic visualization and the soft tissues overlying this structure were infiltrated with 4 ml. of 1% Lidocaine without Epinephrine. A # 20 gauge, Tuohy needle was inserted into the epidural space using a paramedian approach.  The epidural space was localized using loss of resistance along with contralateral oblique bi-planar fluoroscopic views.  After negative aspirate for air, blood, and CSF, a 2 ml. volume of Isovue-250 was injected into the epidural space and the flow of contrast was observed. Radiographs were obtained for documentation purposes.   The injectate was administered into the level noted above.  Additional Comments:  The patient tolerated the procedure well Dressing: 2 x 2 sterile gauze and Band-Aid    Post-procedure details: Patient was observed during the procedure. Post-procedure  instructions were reviewed.  Patient left the clinic in stable condition.

## 2020-03-17 NOTE — Progress Notes (Signed)
Dakota Sanchez - 45 y.o. male MRN [id number]  Date of birth: November 05, 1975  Office Visit Note: Visit Date: 03/17/2020 PCP: Vivi Barrack, MD Referred by: Vivi Barrack, MD  Subjective: Chief Complaint  Patient presents with  . Neck - Pain  . Left Arm - Tingling   HPI:  Dakota Sanchez is a 45 y.o. male who comes in today at the request of Dr. Eunice Blase for planned Left C7-T1 Cervical epidural steroid injection with fluoroscopic guidance.  The patient has failed conservative care including home exercise, medications, time and activity modification.  This injection will be diagnostic and hopefully therapeutic.  Please see requesting physician notes for further details and justification.  MRI reviewed with images and spine model.  MRI reviewed in the note below.    ROS Otherwise per HPI.  Assessment & Plan: Visit Diagnoses:    ICD-10-CM   1. 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   Meds & Orders:  Meds ordered this encounter  Medications  . betamethasone acetate-betamethasone sodium phosphate (CELESTONE) injection 12 mg    Orders Placed This Encounter  Procedures  . XR C-ARM NO REPORT  . Epidural Steroid injection    Follow-up: Return for visit to requesting physician as needed.   Procedures: No procedures performed  Cervical Epidural Steroid Injection - Interlaminar Approach with Fluoroscopic Guidance  Patient: Dakota Sanchez      Date of Birth: 1976/03/03 MRN: [id number] PCP: Vivi Barrack, MD      Visit Date: 03/17/2020   Universal Protocol:    Date/Time: 01/06/229:23 AM  Consent Given By: the patient  Position:  PRONE  Additional Comments: Vital signs were monitored before and after the procedure. Patient was prepped and draped in the usual sterile fashion. The correct patient, procedure, and site was verified.   Injection Procedure Details:   Procedure diagnoses: Cervical radiculopathy [M54.12]    Meds Administered:  Meds ordered this encounter  Medications  . betamethasone acetate-betamethasone sodium phosphate (CELESTONE) injection 12 mg     Laterality: Left  Location/Site: C7-T1  Needle: 3.5 in., 20 ga. Tuohy  Needle Placement: Paramedian epidural space  Findings:  -Comments: Excellent flow of contrast into the epidural space.  Procedure Details: Using a paramedian approach from the side mentioned above, the region overlying the inferior lamina was localized under fluoroscopic visualization and the soft tissues overlying this structure were infiltrated with 4 ml. of 1% Lidocaine without Epinephrine. A # 20 gauge, Tuohy needle was inserted into the epidural space using a paramedian approach.  The epidural space was localized using loss of resistance along with contralateral oblique bi-planar fluoroscopic views.  After negative aspirate for air, blood, and CSF, a 2 ml. volume of Isovue-250 was injected into the epidural space and the flow of contrast was observed. Radiographs were obtained for documentation purposes.   The injectate was administered into the level noted above.  Additional Comments:  The patient tolerated the procedure well Dressing: 2 x 2 sterile gauze and Band-Aid    Post-procedure details: Patient was observed during the procedure. Post-procedure instructions were reviewed.  Patient left the clinic in stable condition.     Clinical History: MRI CERVICAL  SPINE WITHOUT CONTRAST  TECHNIQUE: Multiplanar, multisequence MR imaging of the cervical spine was performed. No intravenous contrast was administered.  COMPARISON:  01/14/2019 cervical spine  radiographs.  FINDINGS: Alignment: Straightening of lordosis.  Vertebrae: Vertebral body heights are preserved. Minimal multilevel Modic type 1 endplate degenerative changes. No focal osseous lesion.  Cord: Normal signal and morphology.  Posterior Fossa, vertebral arteries: Negative.  Disc levels: Multilevel desiccation.  C2-3: No significant disc bulge. Patent spinal canal and neural foramen.  C3-4: Small disc osteophyte complex with uncovertebral and facet degenerative spurring. Patent spinal canal and neural foramen.  C4-5: Uncovertebral degenerative spurring. No significant disc bulge. Patent spinal canal and left neural foramen. Mild right neural foraminal narrowing.  C5-6: Disc osteophyte complex with superimposed left subarticular protrusion, uncovertebral and facet degenerative spurring. Patent spinal canal and right neural foramen. Moderate left neural foraminal narrowing.  C6-7: Shallow right paracentral protrusion with uncovertebral degenerative spurring. Patent spinal canal and neural foramen.  C7-T1: Shallow central protrusion with uncovertebral degenerative spurring. Patent spinal canal and neural foramen.  Paraspinal tissues: Negative.  IMPRESSION: Multilevel spondylosis.  Patent spinal canal.  Moderate left C5-6 neural foraminal narrowing.  Mild right C4-5 neural foraminal narrowing.   Electronically Signed   By: Primitivo Gauze M.D.   On: 02/10/2020 08:50     Objective:  VS:  HT:    WT:   BMI:     BP:102/66  HR:61bpm  TEMP: ( )  RESP:  Physical Exam Vitals and nursing note reviewed.  Constitutional:      General: He is not in acute distress.    Appearance: Normal appearance. He is not ill-appearing.  HENT:     Head: Normocephalic and atraumatic.     Right Ear: External ear normal.     Left Ear: External ear normal.  Eyes:     Extraocular Movements: Extraocular movements intact.  Cardiovascular:     Rate and  Rhythm: Normal rate.     Pulses: Normal pulses.  Abdominal:     General: There is no distension.     Palpations: Abdomen is soft.  Musculoskeletal:        General: No signs of injury.     Cervical back: Neck supple. Tenderness present. No rigidity.     Right lower leg: No edema.     Left lower leg: No edema.     Comments: Patient has good strength in the upper extremities with 5 out of 5 strength in wrist extension long finger flexion APB.  No intrinsic hand muscle atrophy.  Negative Hoffmann's test.  Lymphadenopathy:     Cervical: No cervical adenopathy.  Skin:    Findings: No erythema or rash.  Neurological:     General: No focal deficit present.     Mental Status: He is alert and oriented to person, place, and time.     Sensory: No sensory deficit.     Motor: No weakness or abnormal muscle tone.     Coordination: Coordination normal.  Psychiatric:        Mood and Affect: Mood normal.        Behavior: Behavior normal.      Imaging: No results found.

## 2020-04-22 ENCOUNTER — Other Ambulatory Visit: Payer: Self-pay | Admitting: Family Medicine

## 2020-04-25 ENCOUNTER — Encounter: Payer: Self-pay | Admitting: Family Medicine

## 2020-04-26 ENCOUNTER — Other Ambulatory Visit: Payer: Self-pay

## 2020-04-26 MED ORDER — TIZANIDINE HCL 4 MG PO TABS: ORAL_TABLET | ORAL | 0 refills | Status: DC

## 2020-04-26 MED ORDER — MELOXICAM 15 MG PO TABS: ORAL_TABLET | ORAL | 0 refills | Status: DC

## 2020-04-26 MED ORDER — GABAPENTIN 100 MG PO CAPS: ORAL_CAPSULE | ORAL | 3 refills | Status: DC

## 2020-04-28 MED ORDER — TIZANIDINE HCL 4 MG PO TABS: 4.0000 mg | ORAL_TABLET | Freq: Four times a day (QID) | ORAL | 0 refills | Status: DC | PRN

## 2020-05-27 ENCOUNTER — Other Ambulatory Visit: Payer: Self-pay | Admitting: Family Medicine

## 2020-07-04 ENCOUNTER — Other Ambulatory Visit: Payer: Self-pay | Admitting: Family Medicine

## 2020-11-09 ENCOUNTER — Encounter: Payer: Self-pay | Admitting: Family Medicine

## 2020-11-09 ENCOUNTER — Other Ambulatory Visit: Payer: Self-pay

## 2020-11-09 ENCOUNTER — Ambulatory Visit (INDEPENDENT_AMBULATORY_CARE_PROVIDER_SITE_OTHER): Payer: PRIVATE HEALTH INSURANCE | Admitting: Family Medicine

## 2020-11-09 VITALS — BP 104/70 | HR 70 | Temp 98.1°F | Ht 66.0 in | Wt 150.6 lb

## 2020-11-09 DIAGNOSIS — M25561 Pain in right knee: Secondary | ICD-10-CM

## 2020-11-09 DIAGNOSIS — Z1211 Encounter for screening for malignant neoplasm of colon: Secondary | ICD-10-CM

## 2020-11-09 DIAGNOSIS — Z0001 Encounter for general adult medical examination with abnormal findings: Secondary | ICD-10-CM | POA: Diagnosis not present

## 2020-11-09 DIAGNOSIS — E785 Hyperlipidemia, unspecified: Secondary | ICD-10-CM

## 2020-11-09 DIAGNOSIS — M542 Cervicalgia: Secondary | ICD-10-CM

## 2020-11-09 LAB — COMPREHENSIVE METABOLIC PANEL
ALT: 19 U/L (ref 0–53)
AST: 19 U/L (ref 0–37)
Albumin: 4.5 g/dL (ref 3.5–5.2)
Alkaline Phosphatase: 65 U/L (ref 39–117)
BUN: 19 mg/dL (ref 6–23)
CO2: 28 mEq/L (ref 19–32)
Calcium: 9.5 mg/dL (ref 8.4–10.5)
Chloride: 102 mEq/L (ref 96–112)
Creatinine, Ser: 0.94 mg/dL (ref 0.40–1.50)
GFR: 98.38 mL/min (ref >60.00)
Glucose, Bld: 83 mg/dL (ref 70–99)
Potassium: 4.9 mEq/L (ref 3.5–5.1)
Sodium: 137 mEq/L (ref 135–145)
Total Bilirubin: 0.5 mg/dL (ref 0.2–1.2)
Total Protein: 6.8 g/dL (ref 6.0–8.3)

## 2020-11-09 LAB — LIPID PANEL
Cholesterol: 156 mg/dL (ref 0–200)
HDL: 52.1 mg/dL (ref >39.00)
LDL Cholesterol: 89 mg/dL (ref 0–99)
NonHDL: 103.57
Total CHOL/HDL Ratio: 3
Triglycerides: 75 mg/dL (ref 0.0–149.0)
VLDL: 15 mg/dL (ref 0.0–40.0)

## 2020-11-09 LAB — CBC
HCT: 41 % (ref 39.0–52.0)
Hemoglobin: 13.9 g/dL (ref 13.0–17.0)
MCHC: 33.9 g/dL (ref 30.0–36.0)
MCV: 89.4 fl (ref 78.0–100.0)
Platelets: 298 10*3/uL (ref 150.0–400.0)
RBC: 4.58 Mil/uL (ref 4.22–5.81)
RDW: 12.4 % (ref 11.5–15.5)
WBC: 3.8 10*3/uL — ABNORMAL LOW (ref 4.0–10.5)

## 2020-11-09 LAB — TSH: TSH: 1.58 u[IU]/mL (ref 0.35–5.50)

## 2020-11-09 NOTE — Progress Notes (Signed)
Chief Complaint:  Dakota Sanchez is a 45 y.o. male who presents today for his annual comprehensive physical exam.    Assessment/Plan:  Chronic Problems Addressed Today: Right knee pain Reassuring exam.  Likely early degenerative changes.  Discussed conservative measures including ice and compression.  Can use over-the-counter meds as needed.  Cervicalgia Follows with physical therapy.  Doing much better.  Dyslipidemia   Doing well with lifestyle modifications.  We will check labs today.   Preventative Healthcare: Check Labs.  Due for colon cancer screening at age 74 which is a couple of months away.  We will order Cologuard today and instructed patient to complete this after he turns 45 in a couple of months.  Patient Counseling(The following topics were reviewed and/or handout was given):  -Nutrition: Stressed importance of moderation in sodium/caffeine intake, saturated fat and cholesterol, caloric balance, sufficient intake of fresh fruits, vegetables, and fiber.  -Stressed the importance of regular exercise.   -Substance Abuse: Discussed cessation/primary prevention of tobacco, alcohol, or other drug use; driving or other dangerous activities under the influence; availability of treatment for abuse.   -Injury prevention: Discussed safety belts, safety helmets, smoke detector, smoking near bedding or upholstery.   -Sexuality: Discussed sexually transmitted diseases, partner selection, use of condoms, avoidance of unintended pregnancy and contraceptive alternatives.   -Dental health: Discussed importance of regular tooth brushing, flossing, and dental visits.  -Health maintenance and immunizations reviewed. Please refer to Health maintenance section.  Return to care in 1 year for next preventative visit.     Subjective:  HPI:  He has no acute complaints today.    Also, he experiences cracking and pain in his right knee which he has to rotate to "pop" it. He has started using  compression stockings to alleviate symptoms for arthritis due to this. Lifestyle Diet: Reasonably healthy diet Exercise: Runs, resumed lifting weights after having had his neck recover  Depression screen [facility] 2/9 11/09/2020  Decreased Interest 0  Down, Depressed, Hopeless 0  PHQ - 2 Score 0  Altered sleeping -  Tired, decreased energy -  Change in appetite -  Feeling bad or failure about yourself  -  Trouble concentrating -  Moving slowly or fidgety/restless -  Suicidal thoughts -  PHQ-9 Score -  Difficult doing work/chores -    Health Maintenance Due  Topic Date Due   HIV Screening  Never done   Hepatitis C Screening  Never done   TETANUS/TDAP  Never done   INFLUENZA VACCINE  10/10/2020     ROS: Per HPI, otherwise a complete review of systems was negative.   PMH:  The following were reviewed and entered/updated in epic: Past Medical History:  Diagnosis Date   Migraines    Patient Active Problem List   Diagnosis Date Noted   Right knee pain 11/09/2020   Cervicalgia 01/25/2020   Dyslipidemia 09/29/2018   Former smoker 09/26/2018   Migraines    History reviewed. No pertinent surgical history.  Family History  Problem Relation Age of Onset   Thyroid disease Sister    Colon cancer Neg Hx    Prostate cancer Neg Hx     Medications- reviewed and updated No current outpatient medications on file.   No current facility-administered medications for this visit.    Allergies-reviewed and updated No Known Allergies  Social History   Socioeconomic History   Marital status: Married    Spouse name: Not on file   Number of children: Not on file  Years of education: Not on file   Highest education level: Not on file  Occupational History   Not on file  Tobacco Use   Smoking status: Former    Types: Cigarettes    Quit date: 11/11/2019    Years since quitting: 0.9   Smokeless tobacco: Never  Vaping Use   Vaping Use: Never used  Substance and Sexual Activity    Alcohol use: Yes   Drug use: Never   Sexual activity: Not on file  Other Topics Concern   Not on file  Social History Narrative   Not on file   Social Determinants of Health   Financial Resource Strain: Not on file  Food Insecurity: Not on file  Transportation Needs: Not on file  Physical Activity: Not on file  Stress: Not on file  Social Connections: Not on file        Objective:  Physical Exam: BP 104/70   Pulse 70   Temp 98.1 F (36.7 C) (Temporal)   Ht 5' 6"  (1.676 m)   Wt 150 lb 9.6 oz (68.3 kg)   SpO2 98%   BMI 24.31 kg/m   Body mass index is 24.31 kg/m. Wt Readings from Last 3 Encounters:  11/09/20 150 lb 9.6 oz (68.3 kg)  01/25/20 150 lb 12.8 oz (68.4 kg)  07/30/19 155 lb 12.8 oz (70.7 kg)   Gen: NAD, resting comfortably HEENT: TMs normal bilaterally. OP clear. No thyromegaly noted.  CV: RRR with no murmurs appreciated Pulm: NWOB, CTAB with no crackles, wheezes, or rhonchi GI: Normal bowel sounds present. Soft, Nontender, Nondistended. MSK: no edema, cyanosis, or clubbing noted.  Right knee without deformity.  No tenderness to palpation.  Forage motion throughout.  No crepitus. Skin: warm, dry Neuro: CN2-12 grossly intact. Strength 5/5 in upper and lower extremities. Reflexes symmetric and intact bilaterally.  Psych: Normal affect and thought content     I,Jordan Kelly,acting as a scribe for Dimas Chyle, MD.,have documented all relevant documentation on the behalf of Dimas Chyle, MD,as directed by  Dimas Chyle, MD while in the presence of Dimas Chyle, MD.  I, Dimas Chyle, MD, have reviewed all documentation for this visit. The documentation on 11/09/20 for the exam, diagnosis, procedures, and orders are all accurate and complete.  Algis Greenhouse. Jerline Pain, MD 11/09/2020 8:29 AM

## 2020-11-09 NOTE — Assessment & Plan Note (Signed)
Reassuring exam.  Likely early degenerative changes.  Discussed conservative measures including ice and compression.  Can use over-the-counter meds as needed.

## 2020-11-09 NOTE — Assessment & Plan Note (Signed)
Doing well with lifestyle modifications.  We will check labs today.

## 2020-11-09 NOTE — Assessment & Plan Note (Signed)
Follows with physical therapy.  Doing much better.

## 2020-11-09 NOTE — Patient Instructions (Signed)
It was very nice to see you today!  We will check blood work today.  We will also order Cologuard.  You are due for your colon cancer screening when you turn 45.  Please continue to use ice and compression on your knee.  Let us know if this does not improve.  I will see back in year for your next physical.  Come back to see me sooner if needed.  Take care, Dr Jerline Pain  PLEASE NOTE:  If you had any lab tests please let us know if you have not heard back within a few days. You may see your results on mychart before we have a chance to review them but we will give you a call once they are reviewed by [place]. If we ordered any referrals today, please let us know if you have not heard from their office within the next week.   Please try these tips to maintain a healthy lifestyle:  Eat at least 3 REAL meals and 1-2 snacks per day.  Aim for no more than 5 hours between eating.  If you eat breakfast, please do so within one hour of getting up.   Each meal should contain half fruits/vegetables, one quarter protein, and one quarter carbs (no bigger than a computer mouse)  Cut down on sweet beverages. This includes juice, soda, and sweet tea.   Drink at least 1 glass of water with each meal and aim for at least 8 glasses per day  Exercise at least 150 minutes every week.    Preventive Care 45-72 Years Old, Male Preventive care refers to lifestyle choices and visits with your health care provider that can promote health and wellness. This includes: A yearly physical exam. This is also called an annual wellness visit. Regular dental and eye exams. Immunizations. Screening for certain conditions. Healthy lifestyle choices, such as: Eating a healthy diet. Getting regular exercise. Not using drugs or products that contain nicotine and tobacco. Limiting alcohol use. What can I expect for my preventive care visit? Physical exam Your health care provider will check your: Height and weight. These may  be used to calculate your BMI (body mass index). BMI is a measurement that tells if you are at a healthy weight. Heart rate and blood pressure. Body temperature. Skin for abnormal spots. Counseling Your health care provider may ask you questions about your: Past medical problems. Family's medical history. Alcohol, tobacco, and drug use. Emotional well-being. Home life and relationship well-being. Sexual activity. Diet, exercise, and sleep habits. Work and work Statistician. Access to firearms. What immunizations do I need? Vaccines are usually given at various ages, according to a schedule. Your health care provider will recommend vaccines for you based on your age, medical history, and lifestyle or other factors, such as travel or where you work. What tests do I need? Blood tests Lipid and cholesterol levels. These may be checked every 5 years, or more often if you are over 62 years old. Hepatitis C test. Hepatitis B test. Screening Lung cancer screening. You may have this screening every year starting at age 61 if you have a 30-pack-year history of smoking and currently smoke or have quit within the past 15 years. Prostate cancer screening. Recommendations will vary depending on your family history and other risks. Genital exam to check for testicular cancer or hernias. Colorectal cancer screening. All adults should have this screening starting at age 45 and continuing until age 26. Your health care provider may recommend screening at  age 45 if you are at increased risk. You will have tests every 1-10 years, depending on your results and the type of screening test. Diabetes screening. This is done by checking your blood sugar (glucose) after you have not eaten for a while (fasting). You may have this done every 1-3 years. STD (sexually transmitted disease) testing, if you are at risk. Follow these instructions at home: Eating and drinking  Eat a diet that includes fresh fruits  and vegetables, whole grains, lean protein, and low-fat dairy products. Take vitamin and mineral supplements as recommended by your health care provider. Do not drink alcohol if your health care provider tells you not to drink. If you drink alcohol: Limit how much you have to 0-2 drinks a day. Be aware of how much alcohol is in your drink. In the [place] one drink equals one 12 oz bottle of beer (355 mL), one 5 oz glass of wine (148 mL), or one 1 oz glass of hard liquor (44 mL). Lifestyle Take daily care of your teeth and gums. Brush your teeth every morning and night with fluoride toothpaste. Floss one time each day. Stay active. Exercise for at least 30 minutes 5 or more days each week. Do not use any products that contain nicotine or tobacco, such as cigarettes, e-cigarettes, and chewing tobacco. If you need help quitting, ask your health care provider. Do not use drugs. If you are sexually active, practice safe sex. Use a condom or other form of protection to prevent STIs (sexually transmitted infections). If told by your health care provider, take low-dose aspirin daily starting at age 95. Find healthy ways to cope with stress, such as: Meditation, yoga, or listening to music. Journaling. Talking to a trusted person. Spending time with friends and family. Safety Always wear your seat belt while driving or riding in a vehicle. Do not drive: If you have been drinking alcohol. Do not ride with someone who has been drinking. When you are tired or distracted. While texting. Wear a helmet and other protective equipment during sports activities. If you have firearms in your house, make sure you follow all gun safety procedures. What's next? Go to your health care provider once a year for an annual wellness visit. Ask your health care provider how often you should have your eyes and teeth checked. Stay up to date on all vaccines. This information is not intended to replace advice given to  you by your health care provider. Make sure you discuss any questions you have with your health care provider. Document Revised: 05/06/2020 Document Reviewed: 02/20/2018 Elsevier Patient Education  2022 [employer].

## 2020-11-11 NOTE — Progress Notes (Signed)
Please inform patient of the following:  Good news! His labs are all NORMAL. Would like for him to keep up the good work and we can recheck in a year or so.  Dakota Sanchez. Jerline Pain, MD 11/11/2020 11:30 AM

## 2021-02-06 IMAGING — XA DG INJECT/[PERSON_NAME] INC NEEDLE/CATH/PLC EPI/CERV/THOR W/IMG
2 series · 2 of 2 positions shown · non-contrast
Comparison: none

CLINICAL DATA: Cervical spondylosis without myelopathy. Left neck
and arm pain with tingling.

[Series 2: ortho standard · 1 of 1 slices shown (1 of 2)]
[im 1/1]
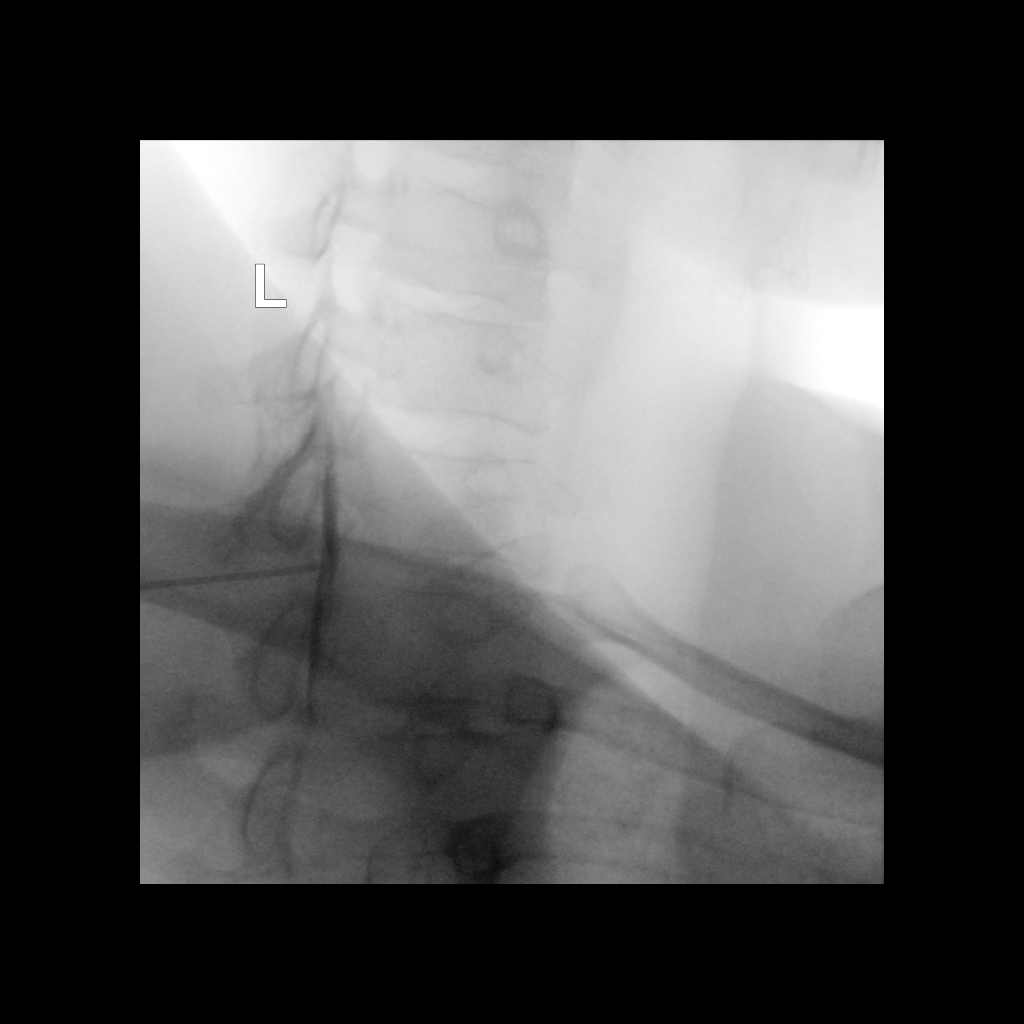

[Series 3: ortho standard · 1 of 1 slices shown (2 of 2)]
[im 1/1]
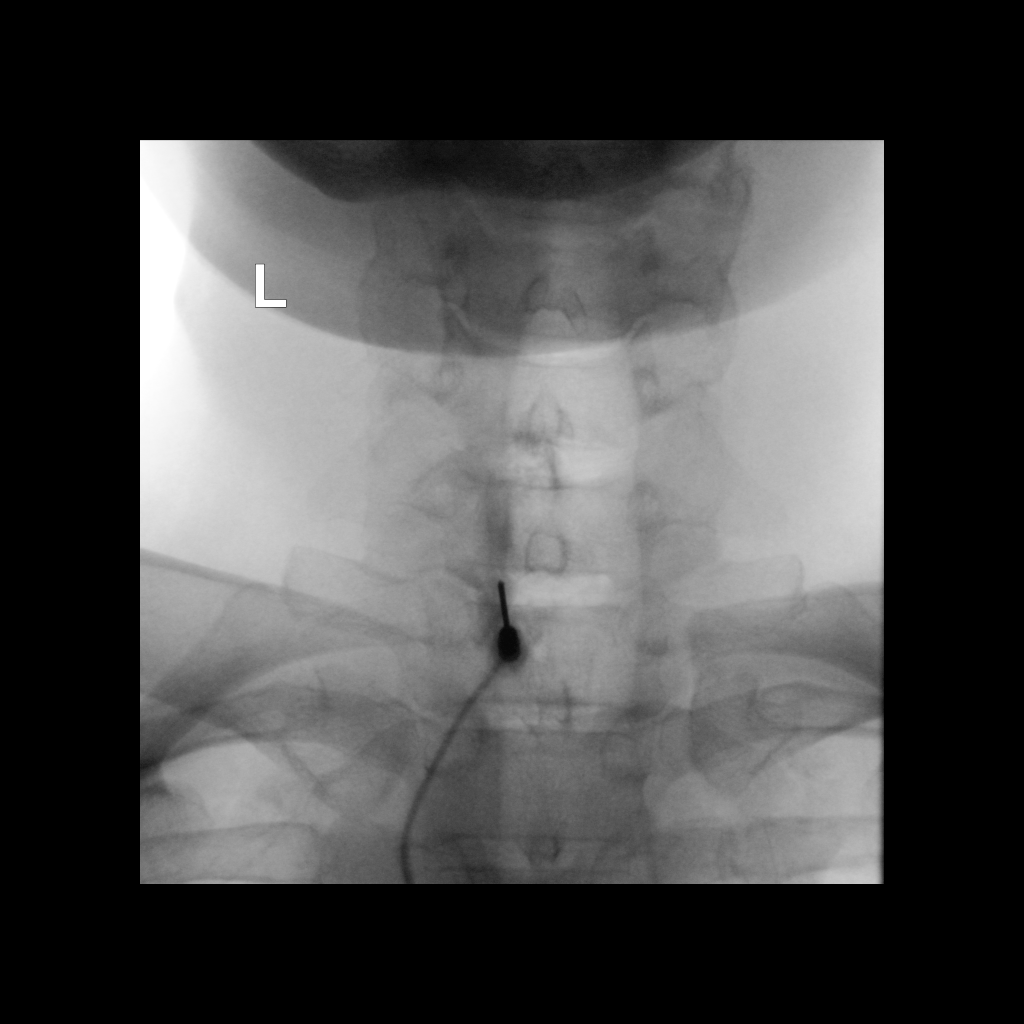

[2 of 2 positions shown; findings below may reference images not displayed]

FLUOROSCOPY TIME:  Fluoroscopy Time: 53 seconds

Radiation Exposure Index: 43.10 microGray*m^2

PROCEDURE:
The procedure, risks, benefits, and alternatives were explained to
the patient. Questions regarding the procedure were encouraged and
answered. The patient understands and consents to the procedure.

CERVICAL EPIDURAL INJECTION

An interlaminar approach was initially performed on the left at
C6-7. A 3.5 inch 20 gauge epidural needle was advanced using
loss-of-resistance technique.

DIAGNOSTIC EPIDURAL INJECTION

Injection of Isovue-M 300 demonstrated epidural spread as well as
opacification a vein. The needle was withdrawn and redirected into a
different portion of the epidural space at C6-7, however vascular
opacification was again encountered. Therefore, the needle was
completely removed and a left interlaminar approach was subsequently
performed at C7-T1. Injection at C7-T1 demonstrated a good epidural
pattern with spread above and below the level of needle placement on
the left without vascular opacification.

THERAPEUTICEPIDURAL INJECTION

1.5 ml of Kenalog 40 mixed with 2 ml of normal saline were then
instilled. The procedure was well-tolerated, and the patient was
discharged thirty minutes following the injection in good condition.
IMPRESSION: Technically successful interlaminar epidural injection on the left
at C7-T1.

## 2021-02-21 LAB — COLOGUARD: COLOGUARD: NEGATIVE

## 2021-02-22 NOTE — Progress Notes (Signed)
Please inform patient of the following:  Good news! Cologuard is negative. We can recheck in 3 years.  Algis Greenhouse. Jerline Pain, MD 02/22/2021 8:04 AM

## 2021-05-25 ENCOUNTER — Ambulatory Visit (INDEPENDENT_AMBULATORY_CARE_PROVIDER_SITE_OTHER): Payer: PRIVATE HEALTH INSURANCE | Admitting: Family Medicine

## 2021-05-25 ENCOUNTER — Ambulatory Visit (INDEPENDENT_AMBULATORY_CARE_PROVIDER_SITE_OTHER)
Admission: RE | Admit: 2021-05-25 | Discharge: 2021-05-25 | Disposition: A | Discharge status: Home / Self Care | Payer: PRIVATE HEALTH INSURANCE | Source: Ambulatory Visit | Attending: Family Medicine | Admitting: Family Medicine

## 2021-05-25 ENCOUNTER — Other Ambulatory Visit: Payer: Self-pay

## 2021-05-25 ENCOUNTER — Encounter: Payer: Self-pay | Admitting: Family Medicine

## 2021-05-25 VITALS — BP 118/76 | HR 70 | Temp 99.4°F | Ht 66.0 in | Wt 152.2 lb

## 2021-05-25 DIAGNOSIS — M79642 Pain in left hand: Secondary | ICD-10-CM

## 2021-05-25 NOTE — Patient Instructions (Signed)
It was very nice to see you today! ? ?We splinted your hand today.  We will check an x-ray to see if it is broken.  If it is broken please continue to wear the splint for the next couple of weeks.  He can use over-the-counter meds as needed. ? ?Take care, ?Dr Jerline Pain ? ?PLEASE NOTE: ? ?If you had any lab tests please let us know if you have not heard back within a few days. You may see your results on mychart before we have a chance to review them but we will give you a call once they are reviewed by [place]. If we ordered any referrals today, please let us know if you have not heard from their office within the next week.  ? ?Please try these tips to maintain a healthy lifestyle: ? ?Eat at least 3 REAL meals and 1-2 snacks per day.  Aim for no more than 5 hours between eating.  If you eat breakfast, please do so within one hour of getting up.  ? ?Each meal should contain half fruits/vegetables, one quarter protein, and one quarter carbs (no bigger than a computer mouse) ? ?Cut down on sweet beverages. This includes juice, soda, and sweet tea.  ? ?Drink at least 1 glass of water with each meal and aim for at least 8 glasses per day ? ?Exercise at least 150 minutes every week.   ?

## 2021-05-25 NOTE — Progress Notes (Signed)
? ?  Dakota Sanchez is a 46 y.o. male who presents today for an office visit. ? ?Assessment/Plan:  ?Left Hand Pain ?Check x-ray rule out fracture.  Hand was splinted today in the office.  May be contusion.  Depending on results may need referral to see orthopedics.  He can use over-the-counter meds as needed.  Pain is currently manageable. ? ? ?  ?Subjective:  ?HPI: ? ?Patient here with left hand pain. He fell about 2-3 weeks ago. He notes he was coming down the stairs where he missed the stairs and fell. He fell on his left hand. No loss of consciousness. He has noticed some bruising on hip and shoulder after the fall. This has resolved. However, pain is still present. He notes pain is worse with certain motions. Had swelling after the fall but this has improved. He has tried taking Advil to help alleviate the pain. He has not tried any other medication. No pain in the wrist. No other injuries.  ? ?   ?  ?Objective:  ?Physical Exam: ?BP 118/76 (BP Location: Right Arm)   Pulse 70   Temp 99.4 ?F (37.4 ?C) (Temporal)   Ht 5' 6"  (1.676 m)   Wt 152 lb 3.2 oz (69 kg)   SpO2 99%   BMI 24.57 kg/m?   ?Gen: No acute distress, resting comfortably ?MSK ?- Left Hand: Faint ecchymosis noted to dorsal aspect of left hand.  Pain with palpation of third metacarpal.  Neurovascular intact distally.  No snuffbox tenderness. ?Neuro: Grossly normal, moves all extremities ?Psych: Normal affect and thought content ? ?   ? ? ?I,Savera Zaman,acting as a Education administrator for Dimas Chyle, MD.,have documented all relevant documentation on the behalf of Dimas Chyle, MD,as directed by  Dimas Chyle, MD while in the presence of Dimas Chyle, MD.  ? ?I, Dimas Chyle, MD, have reviewed all documentation for this visit. The documentation on 05/25/21 for the exam, diagnosis, procedures, and orders are all accurate and complete. ? ?Algis Greenhouse. Jerline Pain, MD ?05/25/2021 4:02 PM  ? ?

## 2021-05-29 NOTE — Progress Notes (Signed)
Please inform patient of the following: ? ?Good news! No fracture. He probably just has a bruise. Would like for him to let us know if pain is not improving over the next couple of weeks. ? ?Algis Greenhouse. Jerline Pain, MD ?05/29/2021 8:01 AM  ?

## 2021-12-04 ENCOUNTER — Encounter: Payer: Self-pay | Admitting: *Deleted

## 2021-12-14 ENCOUNTER — Encounter: Payer: PRIVATE HEALTH INSURANCE | Admitting: Family Medicine

## 2022-01-03 ENCOUNTER — Ambulatory Visit (INDEPENDENT_AMBULATORY_CARE_PROVIDER_SITE_OTHER): Payer: PRIVATE HEALTH INSURANCE | Admitting: Physician Assistant

## 2022-01-03 ENCOUNTER — Encounter: Payer: Self-pay | Admitting: Physician Assistant

## 2022-01-03 VITALS — BP 110/72 | HR 68 | Temp 98.2°F | Ht 66.0 in | Wt 150.0 lb

## 2022-01-03 DIAGNOSIS — J029 Acute pharyngitis, unspecified: Secondary | ICD-10-CM | POA: Diagnosis not present

## 2022-01-03 LAB — POCT RAPID STREP A (OFFICE): Rapid Strep A Screen: POSITIVE — AB

## 2022-01-03 MED ORDER — AMOXICILLIN 500 MG PO CAPS: 500.0000 mg | ORAL_CAPSULE | Freq: Two times a day (BID) | ORAL | 0 refills | Status: AC

## 2022-01-03 NOTE — Progress Notes (Signed)
Dakota Sanchez is a 46 y.o. male here for a new problem.  History of Present Illness:   Chief Complaint  Patient presents with   Sore Throat    Pt c/o sore throat started yesterday, Denies fever or chills, Fatigue. Wife dx on Monday with Strep.    HPI  Sore throat Onset last night with a scratchy throat. Pt's wife also tested positive for strep x4 days ago.  Accompanying mild cough. Denies any other symptoms including fever or chills. Has not taken anything for his symptoms.  Past Medical History:  Diagnosis Date   Migraines      Social History   Tobacco Use   Smoking status: Former    Types: Cigarettes    Quit date: 11/11/2019    Years since quitting: 2.1   Smokeless tobacco: Never  Vaping Use   Vaping Use: Never used  Substance Use Topics   Alcohol use: Yes   Drug use: Never    History reviewed. No pertinent surgical history.  Family History  Problem Relation Age of Onset   Thyroid disease Sister    Colon cancer Neg Hx    Prostate cancer Neg Hx     No Known Allergies  Current Medications:   Current Outpatient Medications:    amoxicillin (AMOXIL) 500 MG capsule, Take 1 capsule (500 mg total) by mouth 2 (two) times daily for 10 days., Disp: 20 capsule, Rfl: 0   Review of Systems:   Review of Systems  Constitutional:  Negative for chills, fever, malaise/fatigue and weight loss.  HENT:  Positive for sore throat. Negative for hearing loss and sinus pain.   Respiratory:  Positive for cough. Negative for hemoptysis.   Cardiovascular:  Negative for chest pain, palpitations, leg swelling and PND.  Gastrointestinal:  Negative for abdominal pain, constipation, diarrhea, heartburn, nausea and vomiting.  Genitourinary:  Negative for dysuria, frequency and urgency.  Musculoskeletal:  Negative for back pain, myalgias and neck pain.  Skin:  Negative for itching and rash.  Neurological:  Negative for dizziness, tingling, seizures and headaches.  Endo/Heme/Allergies:   Negative for polydipsia.  Psychiatric/Behavioral:  Negative for depression. The patient is not nervous/anxious.     Vitals:   Vitals:   01/03/22 0951  BP: 110/72  Pulse: 68  Temp: 98.2 F (36.8 C)  TempSrc: Temporal  SpO2: 99%  Weight: 150 lb (68 kg)  Height: 5\' 6"  (1.676 m)     Body mass index is 24.21 kg/m.  Physical Exam:   Physical Exam Vitals and nursing note reviewed.  Constitutional:      General: He is not in acute distress.    Appearance: He is well-developed. He is not ill-appearing or toxic-appearing.  HENT:     Head: Normocephalic and atraumatic.     Right Ear: Tympanic membrane, ear canal and external ear normal. Tympanic membrane is not erythematous, retracted or bulging.     Left Ear: Tympanic membrane, ear canal and external ear normal. Tympanic membrane is not erythematous, retracted or bulging.     Nose: Nose normal.     Right Sinus: No maxillary sinus tenderness or frontal sinus tenderness.     Left Sinus: No maxillary sinus tenderness or frontal sinus tenderness.     Mouth/Throat:     Pharynx: Uvula midline. No posterior oropharyngeal erythema.  Eyes:     General: Lids are normal.     Conjunctiva/sclera: Conjunctivae normal.  Neck:     Trachea: Trachea normal.  Cardiovascular:     Rate  and Rhythm: Normal rate and regular rhythm.     Pulses: Normal pulses.     Heart sounds: Normal heart sounds, S1 normal and S2 normal.  Pulmonary:     Effort: Pulmonary effort is normal.     Breath sounds: Normal breath sounds. No decreased breath sounds, wheezing, rhonchi or rales.  Lymphadenopathy:     Cervical: No cervical adenopathy.  Skin:    General: Skin is warm and dry.  Neurological:     Mental Status: He is alert.     GCS: GCS eye subscore is 4. GCS verbal subscore is 5. GCS motor subscore is 6.  Psychiatric:        Speech: Speech normal.        Behavior: Behavior normal. Behavior is cooperative.    Results for orders placed or performed in visit  on 01/03/22  POCT rapid strep A  Result Value Ref Range   Rapid Strep A Screen Positive (A) Negative    Assessment and Plan:   Sore throat Strep positive No red flags Start amoxicillin 500 mg BID x 10 days NSAIDs for pain/inflammation Follow-up if new/worsening sx   I,Alexis Herring,acting as a scribe for [employer], PA.,have documented all relevant documentation on the behalf of Inda Coke, PA,as directed by  Inda Coke, PA while in the presence of Inda Coke, [place].  I, Inda Coke, [place], have reviewed all documentation for this visit. The documentation on 01/03/22 for the exam, diagnosis, procedures, and orders are all accurate and complete.  Inda Coke PA-C

## 2022-01-05 ENCOUNTER — Ambulatory Visit: Payer: PRIVATE HEALTH INSURANCE | Admitting: Family Medicine

## 2022-03-13 ENCOUNTER — Telehealth: Payer: PRIVATE HEALTH INSURANCE | Admitting: Family Medicine

## 2022-03-13 DIAGNOSIS — Z20828 Contact with and (suspected) exposure to other viral communicable diseases: Secondary | ICD-10-CM | POA: Diagnosis not present

## 2022-03-13 DIAGNOSIS — R6889 Other general symptoms and signs: Secondary | ICD-10-CM

## 2022-03-13 MED ORDER — OSELTAMIVIR PHOSPHATE 75 MG PO CAPS: 75.0000 mg | ORAL_CAPSULE | Freq: Two times a day (BID) | ORAL | 0 refills | Status: AC

## 2022-03-13 NOTE — Progress Notes (Signed)
Virtual Visit Consent   [name], you are scheduled for a virtual visit with a [place] provider today. Just as with appointments in the office, your consent must be obtained to participate. Your consent will be active for this visit and any virtual visit you may have with one of our providers in the next 365 days. If you have a MyChart account, a copy of this consent can be sent to you electronically.  As this is a virtual visit, video technology does not allow for your provider to perform a traditional examination. This may limit your provider's ability to fully assess your condition. If your provider identifies any concerns that need to be evaluated in person or the need to arrange testing (such as labs, EKG, etc.), we will make arrangements to do so. Although advances in technology are sophisticated, we cannot ensure that it will always work on either your end or our end. If the connection with a video visit is poor, the visit may have to be switched to a telephone visit. With either a video or telephone visit, we are not always able to ensure that we have a secure connection.  By engaging in this virtual visit, you consent to the provision of healthcare and authorize for your insurance to be billed (if applicable) for the services provided during this visit. Depending on your insurance coverage, you may receive a charge related to this service.  I need to obtain your verbal consent now. Are you willing to proceed with your visit today? Dakota Sanchez has provided verbal consent on 03/13/2022 for a virtual visit (video or telephone). Dakota Mayo, NP  Date: 03/13/2022 10:16 AM  Virtual Visit via Video Note   I, Dakota Sanchez, connected with  Dakota Sanchez  ([id number], 11/18/1975) on 03/13/22 at 10:15 AM EST by a video-enabled telemedicine application and verified that I am speaking with the correct person using two identifiers.  Location: Patient: Virtual Visit Location Patient:  Home Provider: Virtual Visit Location Provider: Home Office   I discussed the limitations of evaluation and management by telemedicine and the availability of in person appointments. The patient expressed understanding and agreed to proceed.    History of Present Illness: Dakota Sanchez is a 47 y.o. who identifies as a male who was assigned male at birth, and is being seen today for flu like symptoms- known flu exposure in last several days. Onset was Sunday- Congestion, headache, chest tightness, cough, sore throat, ear pain, body aches and chills. Denies active chest pain, shortness of breath, fever. No flu vaccine.  Problems:  Patient Active Problem List   Diagnosis Date Noted   Right knee pain 11/09/2020   Cervicalgia 01/25/2020   Dyslipidemia 09/29/2018   Former smoker 09/26/2018   Migraines     Allergies: No Known Allergies Medications: No current outpatient medications on file.  Observations/Objective: Patient is well-developed, well-nourished in no acute distress.  Resting comfortably  at home.  Head is normocephalic, atraumatic.  No labored breathing.  Speech is clear and coherent with logical content.  Patient is alert and oriented at baseline.    Assessment and Plan:  1. Exposure to the flu  - oseltamivir (TAMIFLU) 75 MG capsule; Take 1 capsule (75 mg total) by mouth 2 (two) times daily for 5 days.  Dispense: 10 capsule; Refill: 0  2. Flu-like symptoms  - oseltamivir (TAMIFLU) 75 MG capsule; Take 1 capsule (75 mg total) by mouth 2 (two) times daily for 5 days.  Dispense: 10  capsule; Refill: 0   -Take meds as prescribed -Flonase -Rest -Use a cool mist humidifier especially during the winter months when heat dries out the air. - Use saline nose sprays frequently to help soothe nasal passages and promote drainage. -stay hydrated by drinking plenty of fluids - Keep thermostat turn down low to prevent drying out sinuses - For any cough or congestion- robitussin  DM or Delsym as needed - For fever or aches or pains- take tylenol or ibuprofen as directed on bottle             * for fevers greater than 101 orally you may alternate ibuprofen and tylenol every 3 hours.  If you do not improve you will need a follow up visit in person.                Reviewed side effects, risks and benefits of medication.    Patient acknowledged agreement and understanding of the plan.   Past Medical, Surgical, Social History, Allergies, and Medications have been Reviewed.    Follow Up Instructions: I discussed the assessment and treatment plan with the patient. The patient was provided an opportunity to ask questions and all were answered. The patient agreed with the plan and demonstrated an understanding of the instructions.  A copy of instructions were sent to the patient via MyChart unless otherwise noted below.    The patient was advised to call back or seek an in-person evaluation if the symptoms worsen or if the condition fails to improve as anticipated.  Time:  I spent 7 minutes with the patient via telehealth technology discussing the above problems/concerns.    Dakota Mayo, NP

## 2022-03-13 NOTE — Patient Instructions (Signed)
  Selena Lesser, thank you for joining Perlie Mayo, NP for today's virtual visit.  While this provider is not your primary care provider (PCP), if your PCP is located in our provider database this encounter information will be shared with them immediately following your visit.   [facility] account gives you access to today's visit and all your visits, tests, and labs performed at [facility] " click here if you don't have a [facility] account or go to [email or web address]  Consent: (Patient) Dakota Sanchez provided verbal consent for this virtual visit at the beginning of the encounter.  Current Medications:  Current Outpatient Medications:    oseltamivir (TAMIFLU) 75 MG capsule, Take 1 capsule (75 mg total) by mouth 2 (two) times daily for 5 days., Disp: 10 capsule, Rfl: 0   Medications ordered in this encounter:  Meds ordered this encounter  Medications   oseltamivir (TAMIFLU) 75 MG capsule    Sig: Take 1 capsule (75 mg total) by mouth 2 (two) times daily for 5 days.    Dispense:  10 capsule    Refill:  0    Order Specific Question:   Supervising Provider    Answer:   Chase Picket [id number]     *If you need refills on other medications prior to your next appointment, please contact your pharmacy*  Follow-Up: Call back or seek an in-person evaluation if the symptoms worsen or if the condition fails to improve as anticipated.  [place] [phone]  Other Instructions -Take meds as prescribed -Flonase -Rest -Use a cool mist humidifier especially during the winter months when heat dries out the air. - Use saline nose sprays frequently to help soothe nasal passages and promote drainage. -stay hydrated by drinking plenty of fluids - Keep thermostat turn down low to prevent drying out sinuses - For any cough or congestion- robitussin DM or Delsym as needed - For fever or aches or pains- take tylenol or ibuprofen as  directed on bottle             * for fevers greater than 101 orally you may alternate ibuprofen and tylenol every 3 hours.  If you do not improve you will need a follow up visit in person.                 If you have been instructed to have an in-person evaluation today at a local Urgent Care facility, please use the link below. It will take you to a list of all of our available [facility], including address, phone number and hours of operation. Please do not delay care.  [facility]  If you or a family member do not have a primary care provider, use the link below to schedule a visit and establish care. When you choose a [facility] primary care physician or advanced practice provider, you gain a long-term partner in health. Find a Primary Care Provider  Learn more about 's in-office and virtual care options: [facility]

## 2022-03-23 ENCOUNTER — Ambulatory Visit (INDEPENDENT_AMBULATORY_CARE_PROVIDER_SITE_OTHER): Payer: PRIVATE HEALTH INSURANCE | Admitting: Family Medicine

## 2022-03-23 VITALS — BP 108/70 | HR 80 | Temp 98.0°F | Ht 66.0 in | Wt 154.4 lb

## 2022-03-23 DIAGNOSIS — J329 Chronic sinusitis, unspecified: Secondary | ICD-10-CM

## 2022-03-23 MED ORDER — AMOXICILLIN-POT CLAVULANATE 875-125 MG PO TABS: 1.0000 | ORAL_TABLET | Freq: Two times a day (BID) | ORAL | 0 refills | Status: DC

## 2022-03-23 NOTE — Patient Instructions (Signed)
It was very nice to see you today!  You have a sinus infection  Please start the Augmentin.  Make sure that you are getting plenty fluids and staying hydrated.  Let us know if not improving over the next 1 to 2 weeks.  Take care, Dr Jerline Pain  PLEASE NOTE:  If you had any lab tests, please let us know if you have not heard back within a few days. You may see your results on mychart before we have a chance to review them but we will give you a call once they are reviewed by [place].   If we ordered any referrals today, please let us know if you have not heard from their office within the next week.   If you had any urgent prescriptions sent in today, please check with the pharmacy within an hour of our visit to make sure the prescription was transmitted appropriately.   Please try these tips to maintain a healthy lifestyle:  Eat at least 3 REAL meals and 1-2 snacks per day.  Aim for no more than 5 hours between eating.  If you eat breakfast, please do so within one hour of getting up.   Each meal should contain half fruits/vegetables, one quarter protein, and one quarter carbs (no bigger than a computer mouse)  Cut down on sweet beverages. This includes juice, soda, and sweet tea.   Drink at least 1 glass of water with each meal and aim for at least 8 glasses per day  Exercise at least 150 minutes every week.

## 2022-03-23 NOTE — Progress Notes (Signed)
   Dakota Sanchez is a 47 y.o. male who presents today for an office visit.  Assessment/Plan:  Sinusitis  No red flags.  Likely secondary bacterial infection related to recent flu.  Given length of symptoms will start Augmentin.  He can continue using over-the-counter meds as well.  Encouraged hydration.  He will let me know if not proving.  Flu He has mostly recovered from his recent flu infection. Reassuring cardiopulmonary exam today.  No signs of bronchitis or pneumonia.  Still has a bit of postviral fatigue and cough which should gradually improve over the next few weeks.  We discussed reasons to return to care.    Subjective:  HPI:  Patient here today with concern for sinus infection.  His symptoms started over a week ago.   He did have flu 2 weeks ago. He did a virtual visit and was started on tamiflu. His symptoms have mostly improved from this but he has still had persistent facial pressure. A lot of cough. A lot of pressure in his face. Some headache. No fevers or chills. Some bloody discharge when sneezing. Feels similar to previous sinus infections.        Objective:  Physical Exam: BP 108/70   Pulse 80   Temp 98 F (36.7 C) (Temporal)   Ht 5\' 6"  (1.676 m)   Wt 154 lb 6.4 oz (70 kg)   SpO2 97%   BMI 24.92 kg/m   Gen: No acute distress, resting comfortably HEENT: Bilateral TMs with clear effusion.  OP clear.  Nasal mucosa erythematous and boggy bilaterally with thick discharge. CV: Regular rate and rhythm with no murmurs appreciated Pulm: Normal work of breathing, clear to auscultation bilaterally with no crackles, wheezes, or rhonchi Neuro: Grossly normal, moves all extremities Psych: Normal affect and thought content      Dmitry Macomber M. Jerline Pain, MD 03/23/2022 2:33 PM

## 2022-03-25 ENCOUNTER — Encounter: Payer: Self-pay | Admitting: Family Medicine

## 2022-03-27 ENCOUNTER — Telehealth: Payer: Self-pay | Admitting: Family Medicine

## 2022-03-27 NOTE — Telephone Encounter (Signed)
Ok to send in a zpack.  Algis Greenhouse. Jerline Pain, MD 03/27/2022 1:02 PM

## 2022-03-27 NOTE — Telephone Encounter (Signed)
See note  Called patient for Sx, unable to contact Pt no voice mail

## 2022-03-27 NOTE — Telephone Encounter (Signed)
Pt was advised to see provider within 3 days. Will call to schedule pt.   Patient Name: Dakota Sanchez Gender: Male DOB: January 04, 1976 Age: 47 Y 2 M 8 D Return Phone Number: [phone] (Primary) Address: City/ State/ Zip: [place]  [address] Client [facility] Client Site [facility] [place] Night Provider Dimas Chyle- MD Contact Type Call Who Is Calling Patient / Member / Family / Caregiver Call Type Triage / Clinical Relationship To Patient Self Return Phone Number [phone] (Primary) Chief Complaint Medication reaction Reason for Call Symptomatic / Request for [place] has widespread rash/allergy to ABX. He was seen for sinus infection was seen FRI and was prescribed Amoxicillan but last time he took it he had allergic reaction also. Translation No Nurse Assessment Nurse: Rolin Barry, RN, Levada Dy Date/Time Eilene Ghazi Time): 03/27/2022 7:48:23 AM Confirm and document reason for call. If symptomatic, describe symptoms. ---Caller has widespread rash/allergy to ABX. He was seen for sinus infection was seen FRI and was prescribed Amoxicillin but last time he took it he had allergic reaction also. States that he had a rash on the forehead before. Amox - Clav started on Friday. Rash started on Sunday. No temp. Does the patient have any new or worsening symptoms? ---Yes Will a triage be completed? ---Yes Related visit to physician within the last 2 weeks? ---Yes Does the PT have any chronic conditions? (i.e. diabetes, asthma, this includes High risk factors for pregnancy, etc.) ---No Is this a behavioral health or substance abuse call? ---No Guidelines Guideline Title Affirmed Question Affirmed Notes Nurse Date/Time (Eastern Time) Rash or Redness - Localized [1] Pimples (localized) AND [place].Ok ] no improvement after using Care Advice Deaton, RN, Levada Dy 03/27/2022 7:50:55 AM Disp.  Time Eilene Ghazi Time) Disposition Final User 03/27/2022 7:53:47 AM Call PCP within 24 Hours Yes Deaton, RN, Levada Dy Final Disposition 03/27/2022 7:53:47 AM Call PCP within 24 Hours Yes Deaton, RN, Levada Dy Disposition Overriden: SEE PCP WITHIN 3 DAYS Override Reason: Patient's symptoms need a higher level of care Caller Disagree/Comply Comply Caller Understands Yes PreDisposition Did not know what to do Care Advice Given Per Guideline CALL PCP WITHIN 24 HOURS: * You need to discuss this with your doctor (or NP/PA) within the next 24 hours. * IF OFFICE WILL BE OPEN: Call the office when it opens tomorrow morning. CALL BACK IF: * Rash becomes worse. CARE ADVICE given per Rash - Localized and Cause Unknown (Adult) guideline. Comments User: Saverio Danker, RN Date/Time Eilene Ghazi Time): 03/27/2022 7:55:39 AM Caller advised that the rash is on his forehead and chest, pink, red bumps. No other sx. Same reaction to Amox before, was advised to call if rash started. Caller was triaged and will call office when open within 24 hours. Caller also advised that he sent a my chart message to the provider. Referrals REFERRED TO PCP OFFICE

## 2022-03-28 ENCOUNTER — Other Ambulatory Visit: Payer: Self-pay | Admitting: *Deleted

## 2022-03-28 MED ORDER — AZITHROMYCIN 250 MG PO TABS: ORAL_TABLET | ORAL | 0 refills | Status: AC

## 2022-03-28 NOTE — Telephone Encounter (Signed)
See note

## 2022-03-29 ENCOUNTER — Other Ambulatory Visit: Payer: Self-pay | Admitting: *Deleted

## 2022-03-29 ENCOUNTER — Encounter: Payer: Self-pay | Admitting: *Deleted

## 2022-03-29 NOTE — Telephone Encounter (Signed)
Rx was send in  Augmentin added to allergic medication list

## 2022-03-29 NOTE — Telephone Encounter (Signed)
Please see his recent mychart message.  He was supposed to stop the augmentin and start the zpack. Please make sure augmentin is added to his allergy list.  Dakota Sanchez. Jerline Pain, MD 03/29/2022 7:34 AM

## 2022-11-20 NOTE — Progress Notes (Signed)
Dakota Sanchez is a 47 y.o. male here for a new problem.  History of Present Illness:   No chief complaint on file.   HPI     Past Medical History:  Diagnosis Date   Migraines      Social History   Tobacco Use   Smoking status: Former    Current packs/day: 0.00    Types: Cigarettes    Quit date: 11/11/2019    Years since quitting: 3.0   Smokeless tobacco: Never  Vaping Use   Vaping status: Never Used  Substance Use Topics   Alcohol use: Yes   Drug use: Never    No past surgical history on file.  Family History  Problem Relation Age of Onset   Thyroid disease Sister    Colon cancer Neg Hx    Prostate cancer Neg Hx     Allergies  Allergen Reactions   Augmentin [Amoxicillin-Pot Clavulanate]     Current Medications:  No current outpatient medications on file.   Review of Systems:   ROS  Vitals:   There were no vitals filed for this visit.   There is no height or weight on file to calculate BMI.  Physical Exam:   Physical Exam  Assessment and Plan:   ***   I,Alexander Ruley,acting as a scribe for Jarold Motto, PA.,have documented all relevant documentation on the behalf of Jarold Motto, PA,as directed by  Jarold Motto, PA while in the presence of Jarold Motto, [place].   ***   Jarold Motto, PA-C

## 2022-11-21 ENCOUNTER — Encounter: Payer: Self-pay | Admitting: Physician Assistant

## 2022-11-21 ENCOUNTER — Ambulatory Visit (INDEPENDENT_AMBULATORY_CARE_PROVIDER_SITE_OTHER): Payer: Self-pay | Admitting: Physician Assistant

## 2022-11-21 VITALS — BP 120/80 | HR 73 | Temp 97.5°F | Ht 66.0 in | Wt 148.0 lb

## 2022-11-21 DIAGNOSIS — U071 COVID-19: Secondary | ICD-10-CM

## 2022-11-21 LAB — POC COVID19 BINAXNOW: SARS Coronavirus 2 Ag: POSITIVE — AB

## 2023-02-12 ENCOUNTER — Ambulatory Visit: Payer: Commercial Managed Care - PPO | Admitting: Family Medicine

## 2023-04-12 ENCOUNTER — Encounter: Payer: Self-pay | Admitting: Family Medicine

## 2023-04-30 ENCOUNTER — Encounter: Payer: Commercial Managed Care - PPO | Admitting: Family Medicine

## 2023-07-19 ENCOUNTER — Encounter: Payer: Commercial Managed Care - PPO | Admitting: Family Medicine

## 2024-01-18 ENCOUNTER — Encounter: Payer: Self-pay | Admitting: Family Medicine

## 2024-01-20 ENCOUNTER — Ambulatory Visit (INDEPENDENT_AMBULATORY_CARE_PROVIDER_SITE_OTHER): Admitting: Family Medicine

## 2024-01-20 ENCOUNTER — Encounter: Payer: Self-pay | Admitting: Family Medicine

## 2024-01-20 VITALS — BP 112/64 | HR 85 | Temp 97.5°F | Ht 66.0 in | Wt 144.2 lb

## 2024-01-20 DIAGNOSIS — Z1211 Encounter for screening for malignant neoplasm of colon: Secondary | ICD-10-CM

## 2024-01-20 DIAGNOSIS — E785 Hyperlipidemia, unspecified: Secondary | ICD-10-CM | POA: Diagnosis not present

## 2024-01-20 DIAGNOSIS — Z23 Encounter for immunization: Secondary | ICD-10-CM | POA: Diagnosis not present

## 2024-01-20 DIAGNOSIS — Z0001 Encounter for general adult medical examination with abnormal findings: Secondary | ICD-10-CM

## 2024-01-20 DIAGNOSIS — Z114 Encounter for screening for human immunodeficiency virus [HIV]: Secondary | ICD-10-CM

## 2024-01-20 DIAGNOSIS — Z131 Encounter for screening for diabetes mellitus: Secondary | ICD-10-CM

## 2024-01-20 DIAGNOSIS — Z1159 Encounter for screening for other viral diseases: Secondary | ICD-10-CM | POA: Diagnosis not present

## 2024-01-20 LAB — CBC
HCT: 40.5 % (ref 39.0–52.0)
Hemoglobin: 13.8 g/dL (ref 13.0–17.0)
MCHC: 34.2 g/dL (ref 30.0–36.0)
MCV: 89.6 fl (ref 78.0–100.0)
Platelets: 291 K/uL (ref 150.0–400.0)
RBC: 4.52 Mil/uL (ref 4.22–5.81)
RDW: 12.9 % (ref 11.5–15.5)
WBC: 4.4 K/uL (ref 4.0–10.5)

## 2024-01-20 LAB — COMPREHENSIVE METABOLIC PANEL WITH GFR
ALT: 26 U/L (ref 0–53)
AST: 26 U/L (ref 0–37)
Albumin: 4.7 g/dL (ref 3.5–5.2)
Alkaline Phosphatase: 56 U/L (ref 39–117)
BUN: 20 mg/dL (ref 6–23)
CO2: 29 meq/L (ref 19–32)
Calcium: 9.2 mg/dL (ref 8.4–10.5)
Chloride: 103 meq/L (ref 96–112)
Creatinine, Ser: 0.9 mg/dL (ref 0.40–1.50)
GFR: 101.35 mL/min (ref >60.00)
Glucose, Bld: 89 mg/dL (ref 70–99)
Potassium: 5.3 meq/L — ABNORMAL HIGH (ref 3.5–5.1)
Sodium: 138 meq/L (ref 135–145)
Total Bilirubin: 0.5 mg/dL (ref 0.2–1.2)
Total Protein: 6.5 g/dL (ref 6.0–8.3)

## 2024-01-20 LAB — LIPID PANEL
Cholesterol: 140 mg/dL (ref 0–200)
HDL: 63 mg/dL (ref >39.00)
LDL Cholesterol: 71 mg/dL (ref 0–99)
NonHDL: 77.18
Total CHOL/HDL Ratio: 2
Triglycerides: 31 mg/dL (ref 0.0–149.0)
VLDL: 6.2 mg/dL (ref 0.0–40.0)

## 2024-01-20 LAB — HEMOGLOBIN A1C: Hgb A1c MFr Bld: 5.1 % (ref 4.6–6.5)

## 2024-01-20 LAB — TSH: TSH: 1.58 u[IU]/mL (ref 0.35–5.50)

## 2024-01-20 NOTE — Assessment & Plan Note (Signed)
 Check lipids.  He is doing excellent job with lifestyle modifications.

## 2024-01-20 NOTE — Telephone Encounter (Signed)
 Patient had blood work today on CPE appt

## 2024-01-20 NOTE — Progress Notes (Signed)
 Chief Complaint:  Dakota Sanchez is a 48 y.o. male who presents today for his annual comprehensive physical exam.    Assessment/Plan:  Chronic Problems Addressed Today: Dyslipidemia Check lipids.  He is doing excellent job with lifestyle modifications.  Preventative Healthcare: Flu shot given today.  Check labs.  Due for colonoscopy next month-will order today.  Patient Counseling(The following topics were reviewed and/or handout was given):  -Nutrition: Stressed importance of moderation in sodium/caffeine intake, saturated fat and cholesterol, caloric balance, sufficient intake of fresh fruits, vegetables, and fiber.  -Stressed the importance of regular exercise.   -Substance Abuse: Discussed cessation/primary prevention of tobacco, alcohol, or other drug use; driving or other dangerous activities under the influence; availability of treatment for abuse.   -Injury prevention: Discussed safety belts, safety helmets, smoke detector, smoking near bedding or upholstery.   -Sexuality: Discussed sexually transmitted diseases, partner selection, use of condoms, avoidance of unintended pregnancy and contraceptive alternatives.   -Dental health: Discussed importance of regular tooth brushing, flossing, and dental visits.  -Health maintenance and immunizations reviewed. Please refer to Health maintenance section.  Return to care in 1 year for next preventative visit.     Subjective:  HPI:  He has no acute complaints today. Patient is here today for his annual physical.  See assessment / plan for status of chronic conditions.  Discussed the use of AI scribe software for clinical note transcription with the patient, who gave verbal consent to proceed.  History of Present Illness Dakota Sanchez is a 48 year old male who presents for an annual physical exam.  He has been feeling well over the past year with no significant health issues or recent illnesses since his last visit over a year  ago.  He has lost about ten pounds since his last visit, now weighing around 140 pounds. He maintains an active lifestyle, running approximately fifteen miles a week and engaging in resistance training, including kettlebell circuits and weight lifting, five to six days a week. He also practices interval training with farmer's carries up his driveway.  His diet includes a daily protein shake with protein powder, almond milk, fresh berries, and chia seeds. He avoids fried foods and tries to eat as clean as possible.  Lifestyle Diet: Balanced. Plenty of fruits and vegetables.  Exercise: 5 times weekly.      01/20/2024    7:54 AM  Depression screen PHQ 2/9  Decreased Interest 0  Down, Depressed, Hopeless 0  PHQ - 2 Score 0    Health Maintenance Due  Topic Date Due   HIV Screening  Never done   Hepatitis C Screening  Never done   Influenza Vaccine  10/11/2023     ROS: Per HPI, otherwise a complete review of systems was negative.   PMH:  The following were reviewed and entered/updated in epic: Past Medical History:  Diagnosis Date   Migraines    Patient Active Problem List   Diagnosis Date Noted   Right knee pain 11/09/2020   Cervicalgia 01/25/2020   Dyslipidemia 09/29/2018   Former smoker 09/26/2018   Migraines    History reviewed. No pertinent surgical history.  Family History  Problem Relation Age of Onset   Thyroid  disease Sister    Colon cancer Neg Hx    Prostate cancer Neg Hx     Medications- reviewed and updated No current outpatient medications on file.   No current facility-administered medications for this visit.    Allergies-reviewed and updated Allergies  Allergen Reactions  Augmentin  [Amoxicillin -Pot Clavulanate]     Social History   Socioeconomic History   Marital status: Married    Spouse name: Not on file   Number of children: Not on file   Years of education: Not on file   Highest education level: Not on file  Occupational History    Not on file  Tobacco Use   Smoking status: Former    Current packs/day: 0.00    Types: Cigarettes    Quit date: 11/11/2019    Years since quitting: 4.1   Smokeless tobacco: Never  Vaping Use   Vaping status: Never Used  Substance and Sexual Activity   Alcohol use: Yes   Drug use: Never   Sexual activity: Not on file  Other Topics Concern   Not on file  Social History Narrative   Not on file   Social Drivers of Health   Financial Resource Strain: Not on file  Food Insecurity: Not on file  Transportation Needs: Not on file  Physical Activity: Not on file  Stress: Not on file  Social Connections: Not on file        Objective:  Physical Exam: BP 112/64   Pulse 85   Temp (!) 97.5 F (36.4 C) (Temporal)   Ht 5' 6 (1.676 m)   Wt 144 lb 3.2 oz (65.4 kg)   SpO2 98%   BMI 23.27 kg/m   Body mass index is 23.27 kg/m. Wt Readings from Last 3 Encounters:  01/20/24 144 lb 3.2 oz (65.4 kg)  11/21/22 148 lb (67.1 kg)  03/23/22 154 lb 6.4 oz (70 kg)   Gen: NAD, resting comfortably HEENT: TMs normal bilaterally. OP clear. No thyromegaly noted.  CV: RRR with no murmurs appreciated Pulm: NWOB, CTAB with no crackles, wheezes, or rhonchi GI: Normal bowel sounds present. Soft, Nontender, Nondistended. MSK: no edema, cyanosis, or clubbing noted Skin: warm, dry Neuro: CN2-12 grossly intact. Strength 5/5 in upper and lower extremities. Reflexes symmetric and intact bilaterally.  Psych: Normal affect and thought content     Chelsea Pedretti M. Kennyth, MD 01/20/2024 8:19 AM

## 2024-01-20 NOTE — Patient Instructions (Signed)
 It was very nice to see you today!  VISIT SUMMARY: Today, you had your annual physical exam. You have been feeling well over the past year, maintaining an active lifestyle and a healthy diet. You received a flu shot, and we ordered some blood tests and scheduled a Cologuard test for next month.  YOUR PLAN: ADULT WELLNESS VISIT: Routine visit with no acute concerns. You engage in regular exercise and maintain a healthy diet. Your weight is stable with a recent 10-pound loss. -Administered flu shot. -Ordered blood work for cholesterol, A1c, blood counts, kidney function, liver function, and electrolytes. -Scheduled Cologuard test for next month.  Return in about 1 year (around 01/19/2025) for Annual Physical.   Take care, Dr Kennyth  PLEASE NOTE:  If you had any lab tests, please let us  know if you have not heard back within a few days. You may see your results on mychart before we have a chance to review them but we will give you a call once they are reviewed by us .   If we ordered any referrals today, please let us  know if you have not heard from their office within the next week.   If you had any urgent prescriptions sent in today, please check with the pharmacy within an hour of our visit to make sure the prescription was transmitted appropriately.   Please try these tips to maintain a healthy lifestyle:  Eat at least 3 REAL meals and 1-2 snacks per day.  Aim for no more than 5 hours between eating.  If you eat breakfast, please do so within one hour of getting up.   Each meal should contain half fruits/vegetables, one quarter protein, and one quarter carbs (no bigger than a computer mouse)  Cut down on sweet beverages. This includes juice, soda, and sweet tea.   Drink at least 1 glass of water with each meal and aim for at least 8 glasses per day  Exercise at least 150 minutes every week.     Preventive Care 60-80 Years Old, Male Preventive care refers to lifestyle choices and  visits with your health care provider that can promote health and wellness. Preventive care visits are also called wellness exams. What can I expect for my preventive care visit? Counseling During your preventive care visit, your health care provider may ask about your: Medical history, including: Past medical problems. Family medical history. Current health, including: Emotional well-being. Home life and relationship well-being. Sexual activity. Lifestyle, including: Alcohol, nicotine or tobacco, and drug use. Access to firearms. Diet, exercise, and sleep habits. Safety issues such as seatbelt and bike helmet use. Sunscreen use. Work and work astronomer. Physical exam Your health care provider will check your: Height and weight. These may be used to calculate your BMI (body mass index). BMI is a measurement that tells if you are at a healthy weight. Waist circumference. This measures the distance around your waistline. This measurement also tells if you are at a healthy weight and may help predict your risk of certain diseases, such as type 2 diabetes and high blood pressure. Heart rate and blood pressure. Body temperature. Skin for abnormal spots. What immunizations do I need?  Vaccines are usually given at various ages, according to a schedule. Your health care provider will recommend vaccines for you based on your age, medical history, and lifestyle or other factors, such as travel or where you work. What tests do I need? Screening Your health care provider may recommend screening tests for certain  conditions. This may include: Lipid and cholesterol levels. Diabetes screening. This is done by checking your blood sugar (glucose) after you have not eaten for a while (fasting). Hepatitis B test. Hepatitis C test. HIV (human immunodeficiency virus) test. STI (sexually transmitted infection) testing, if you are at risk. Lung cancer screening. Prostate cancer  screening. Colorectal cancer screening. Talk with your health care provider about your test results, treatment options, and if necessary, the need for more tests. Follow these instructions at home: Eating and drinking  Eat a diet that includes fresh fruits and vegetables, whole grains, lean protein, and low-fat dairy products. Take vitamin and mineral supplements as recommended by your health care provider. Do not drink alcohol if your health care provider tells you not to drink. If you drink alcohol: Limit how much you have to 0-2 drinks a day. Know how much alcohol is in your drink. In the [place] one drink equals one 12 oz bottle of beer (355 mL), one 5 oz glass of wine (148 mL), or one 1 oz glass of hard liquor (44 mL). Lifestyle Brush your teeth every morning and night with fluoride toothpaste. Floss one time each day. Exercise for at least 30 minutes 5 or more days each week. Do not use any products that contain nicotine or tobacco. These products include cigarettes, chewing tobacco, and vaping devices, such as e-cigarettes. If you need help quitting, ask your health care provider. Do not use drugs. If you are sexually active, practice safe sex. Use a condom or other form of protection to prevent STIs. Take aspirin only as told by your health care provider. Make sure that you understand how much to take and what form to take. Work with your health care provider to find out whether it is safe and beneficial for you to take aspirin daily. Find healthy ways to manage stress, such as: Meditation, yoga, or listening to music. Journaling. Talking to a trusted person. Spending time with friends and family. Minimize exposure to UV radiation to reduce your risk of skin cancer. Safety Always wear your seat belt while driving or riding in a vehicle. Do not drive: If you have been drinking alcohol. Do not ride with someone who has been drinking. When you are tired or distracted. While  texting. If you have been using any mind-altering substances or drugs. Wear a helmet and other protective equipment during sports activities. If you have firearms in your house, make sure you follow all gun safety procedures. What's next? Go to your health care provider once a year for an annual wellness visit. Ask your health care provider how often you should have your eyes and teeth checked. Stay up to date on all vaccines. This information is not intended to replace advice given to you by your health care provider. Make sure you discuss any questions you have with your health care provider. Document Revised: 08/24/2020 Document Reviewed: 08/24/2020 Elsevier Patient Education  2024 [employer].

## 2024-01-21 LAB — HEPATITIS C ANTIBODY: Hepatitis C Ab: NONREACTIVE

## 2024-01-21 LAB — HIV ANTIBODY (ROUTINE TESTING W REFLEX)
HIV 1&2 Ab, 4th Generation: NONREACTIVE
HIV FINAL INTERPRETATION: NEGATIVE

## 2024-01-23 ENCOUNTER — Ambulatory Visit: Payer: Self-pay | Admitting: Family Medicine

## 2024-01-23 NOTE — Progress Notes (Signed)
 His potassium was just a little bit elevated.  This is not a clinical concern though he can come back to recheck if he wishes.  I am fine with rechecking in a year with his next of labs.  All of his other labs are at goal.  He should keep up the great work with diet and exercise and we can recheck everything in a year or so.

## 2024-02-10 LAB — COLOGUARD: COLOGUARD: NEGATIVE

## 2024-03-17 ENCOUNTER — Telehealth: Admitting: Physician Assistant

## 2024-03-17 DIAGNOSIS — R051 Acute cough: Secondary | ICD-10-CM | POA: Diagnosis not present

## 2024-03-17 DIAGNOSIS — Z20828 Contact with and (suspected) exposure to other viral communicable diseases: Secondary | ICD-10-CM

## 2024-03-17 DIAGNOSIS — R52 Pain, unspecified: Secondary | ICD-10-CM

## 2024-03-17 DIAGNOSIS — R509 Fever, unspecified: Secondary | ICD-10-CM | POA: Diagnosis not present

## 2024-03-18 MED ORDER — OSELTAMIVIR PHOSPHATE 75 MG PO CAPS: 75.0000 mg | ORAL_CAPSULE | Freq: Two times a day (BID) | ORAL | 0 refills | Status: AC

## 2024-03-18 MED ORDER — BENZONATATE 100 MG PO CAPS: 100.0000 mg | ORAL_CAPSULE | Freq: Three times a day (TID) | ORAL | 0 refills | Status: AC | PRN

## 2024-03-18 MED ORDER — NAPROXEN 500 MG PO TABS: 500.0000 mg | ORAL_TABLET | Freq: Two times a day (BID) | ORAL | 0 refills | Status: AC

## 2024-03-18 NOTE — Progress Notes (Signed)
 E visit for Flu like symptoms   We are sorry that you are not feeling well.  Here is how we plan to help! Based on what you have shared with me it looks like you may have a respiratory virus that may be influenza.  Influenza or the flu is  an infection caused by a respiratory virus. The flu virus is highly contagious and persons who did not receive their yearly flu vaccination may catch the flu from close contact.  We have anti-viral medications to treat the viruses that cause this infection. They are not a cure and only shorten the course of the infection. These prescriptions are most effective when they are given within the first 2 days of flu symptoms. Antiviral medications are indicated if you have a high risk of complications from the flu. You should  also consider an antiviral medication if you are in close contact with someone who is at risk. These medications can help patients avoid complications from the flu but have side effects that you should know.   Possible side effects from Tamiflu  or oseltamivir  include nausea, vomiting, diarrhea, dizziness, headaches, eye redness, sleep problems or other respiratory symptoms. You should not take Tamiflu  if you have an allergy to oseltamivir  or any to the ingredients in Tamiflu .  Based upon your symptoms and potential risk factors I have prescribed Oseltamivir  (Tamiflu ).  It has been sent to your designated pharmacy.  You will take one 75 mg capsule orally twice a day for the next 5 days.   For nasal congestion, you may use an oral decongestant such as Mucinex D or if you have glaucoma or high blood pressure use plain Mucinex.  Saline nasal spray or nasal drops can help and can safely be used as often as needed for congestion.  If you have a sore or scratchy throat, use a saltwater gargle-  to  teaspoon of salt dissolved in a 4-ounce to 8-ounce glass of warm water.  Gargle the solution for approximately 15-30 seconds and then spit.  It is  important not to swallow the solution.  You can also use throat lozenges/cough drops and Chloraseptic spray to help with throat pain or discomfort.  Warm or cold liquids can also be helpful in relieving throat pain.  For headache, pain or general discomfort, you can use Ibuprofen  or Tylenol  as directed.   Some authorities believe that zinc sprays or the use of Echinacea may shorten the course of your symptoms.  I have prescribed the following medications to help lessen symptoms: I have prescribed Tessalon  Perles 100 mg. You may take 1-2 capsules every 8 hours as needed for cough and I have prescribed an anti-inflammatory - Naprosyn 500 mg. Take twice daily as needed for fever or body aches for 2 weeks  You are to isolate at home until you have been fever-free for at least 24 hours without a fever-reducing medication, and symptoms have been steadily improving for 24 hours.  If you must be around other household members who do not have symptoms, you need to make sure that both you and the family members are masking consistently with a high-quality mask.  If you note any worsening of symptoms despite treatment, please seek an in-person evaluation ASAP. If you note any significant shortness of breath or any chest pain, please seek ED evaluation. Please do not delay care!  ANYONE WHO HAS FLU SYMPTOMS SHOULD: Stay home. The flu is highly contagious and going out or to work exposes others! Be  sure to drink plenty of fluids. Water is fine as well as fruit juices, sodas and electrolyte beverages. You may want to stay away from caffeine or alcohol. If you are nauseated, try taking small sips of liquids. How do you know if you are getting enough fluid? Your urine should be a pale yellow or almost colorless. Get rest. Taking a steamy shower or using a humidifier may help nasal congestion and ease sore throat pain. Using a saline nasal spray works much the same way. Cough drops, hard candies and sore throat  lozenges may ease your cough. Line up a caregiver. Have someone check on you regularly.  GET HELP RIGHT AWAY IF: You cannot keep down liquids or your medications. You become short of breath Your fell like you are going to pass out or loose consciousness. Your symptoms persist after you have completed your treatment plan  MAKE SURE YOU  Understand these instructions. Will watch your condition. Will get help right away if you are not doing well or get worse.  Your e-visit answers were reviewed by a board certified advanced clinical practitioner to complete your personal care plan.  Depending on the condition, your plan could have included both over the counter or prescription medications.  If there is a problem please reply  once you have received a response from your provider.  Your safety is important to us .  If you have drug allergies check your prescription carefully.    You can use MyChart to ask questions about todays visit, request a non-urgent call back, or ask for a work or school excuse for 24 hours related to this e-Visit. If it has been greater than 24 hours you will need to follow up with your provider, or enter a new e-Visit to address those concerns.  You will get an e-mail in the next two days asking about your experience.  I hope that your e-visit has been valuable and will speed your recovery. Thank you for using e-visits.   I have spent 5 minutes in review of e-visit questionnaire, review and updating patient chart, medical decision making and response to patient.   Delon CHRISTELLA Dickinson, PA-C

## 2025-01-21 ENCOUNTER — Encounter: Admitting: Family Medicine
